# Patient Record
Sex: Female | Born: 1975
Health system: Southern US, Community
[De-identification: ages and names within clinical notes are randomized; demographics above are authoritative.]

## PROBLEM LIST (undated history)

## (undated) ENCOUNTER — Inpatient Hospital Stay (HOSPITAL_COMMUNITY): Payer: Self-pay

## (undated) DIAGNOSIS — A630 Anogenital (venereal) warts: Secondary | ICD-10-CM

## (undated) DIAGNOSIS — G4730 Sleep apnea, unspecified: Secondary | ICD-10-CM

## (undated) DIAGNOSIS — O1490 Unspecified pre-eclampsia, unspecified trimester: Secondary | ICD-10-CM

## (undated) DIAGNOSIS — I10 Essential (primary) hypertension: Secondary | ICD-10-CM

## (undated) DIAGNOSIS — B977 Papillomavirus as the cause of diseases classified elsewhere: Secondary | ICD-10-CM

## (undated) DIAGNOSIS — R319 Hematuria, unspecified: Secondary | ICD-10-CM

## (undated) HISTORY — DX: Unspecified pre-eclampsia, unspecified trimester: O14.90

## (undated) HISTORY — DX: Sleep apnea, unspecified: G47.30

## (undated) HISTORY — DX: Hematuria, unspecified: R31.9

## (undated) HISTORY — DX: Papillomavirus as the cause of diseases classified elsewhere: B97.7

## (undated) HISTORY — PX: APPENDECTOMY: SHX54

## (undated) HISTORY — DX: Anogenital (venereal) warts: A63.0

---
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
## 2000-01-10 ENCOUNTER — Emergency Department (HOSPITAL_COMMUNITY): Admission: EM | Admit: 2000-01-10 | Discharge: 2000-01-10 | Payer: Self-pay

## 2001-04-26 ENCOUNTER — Other Ambulatory Visit: Admission: RE | Admit: 2001-04-26 | Discharge: 2001-04-26 | Payer: Self-pay | Admitting: Obstetrics and Gynecology

## 2002-04-28 ENCOUNTER — Encounter: Payer: Self-pay | Admitting: Family Medicine

## 2002-04-28 ENCOUNTER — Ambulatory Visit (HOSPITAL_COMMUNITY): Admission: RE | Admit: 2002-04-28 | Discharge: 2002-04-28 | Payer: Self-pay | Admitting: Family Medicine

## 2002-06-13 ENCOUNTER — Other Ambulatory Visit: Admission: RE | Admit: 2002-06-13 | Discharge: 2002-06-13 | Payer: Self-pay | Admitting: Obstetrics & Gynecology

## 2003-03-18 HISTORY — PX: LASER ABLATION OF THE CERVIX: SHX1949

## 2003-07-24 ENCOUNTER — Other Ambulatory Visit: Admission: RE | Admit: 2003-07-24 | Discharge: 2003-07-24 | Payer: Self-pay | Admitting: Obstetrics & Gynecology

## 2003-09-12 ENCOUNTER — Ambulatory Visit (HOSPITAL_COMMUNITY): Admission: RE | Admit: 2003-09-12 | Discharge: 2003-09-12 | Payer: Self-pay | Admitting: Obstetrics & Gynecology

## 2007-06-16 ENCOUNTER — Other Ambulatory Visit: Admission: RE | Admit: 2007-06-16 | Discharge: 2007-06-16 | Payer: Self-pay | Admitting: Obstetrics and Gynecology

## 2008-01-03 ENCOUNTER — Ambulatory Visit: Payer: Self-pay | Admitting: Obstetrics and Gynecology

## 2008-01-03 ENCOUNTER — Inpatient Hospital Stay (HOSPITAL_COMMUNITY): Admission: AD | Admit: 2008-01-03 | Discharge: 2008-01-05 | Payer: Self-pay | Admitting: Obstetrics & Gynecology

## 2009-03-21 ENCOUNTER — Other Ambulatory Visit: Admission: RE | Admit: 2009-03-21 | Discharge: 2009-03-21 | Payer: Self-pay | Admitting: Obstetrics and Gynecology

## 2010-01-07 ENCOUNTER — Emergency Department (HOSPITAL_COMMUNITY): Admission: EM | Admit: 2010-01-07 | Discharge: 2010-01-07 | Payer: Self-pay | Admitting: Emergency Medicine

## 2010-05-29 LAB — BASIC METABOLIC PANEL
BUN: 8 mg/dL (ref 6–23)
CO2: 28 mEq/L (ref 19–32)
Calcium: 9.4 mg/dL (ref 8.4–10.5)
Chloride: 101 mEq/L (ref 96–112)
Creatinine, Ser: 0.77 mg/dL (ref 0.4–1.2)
GFR calc Af Amer: >60 mL/min (ref >60)
GFR calc non Af Amer: >60 mL/min (ref >60)
Glucose, Bld: 133 mg/dL — ABNORMAL HIGH (ref 70–99)
Potassium: 3.5 mEq/L (ref 3.5–5.1)
Sodium: 138 mEq/L (ref 135–145)

## 2010-05-29 LAB — DIFFERENTIAL
Basophils Absolute: 0 10*3/uL (ref 0.0–0.1)
Basophils Relative: 0 % (ref 0–1)
Eosinophils Absolute: 0.2 10*3/uL (ref 0.0–0.7)
Eosinophils Relative: 1 % (ref 0–5)
Lymphocytes Relative: 29 % (ref 12–46)
Lymphs Abs: 3.6 10*3/uL (ref 0.7–4.0)
Monocytes Absolute: 0.6 10*3/uL (ref 0.1–1.0)
Monocytes Relative: 5 % (ref 3–12)
Neutro Abs: 8 10*3/uL — ABNORMAL HIGH (ref 1.7–7.7)
Neutrophils Relative %: 64 % (ref 43–77)

## 2010-05-29 LAB — CBC
HCT: 40.3 % (ref 36.0–46.0)
Hemoglobin: 14 g/dL (ref 12.0–15.0)
MCH: 31 pg (ref 26.0–34.0)
MCHC: 34.8 g/dL (ref 30.0–36.0)
MCV: 89.1 fL (ref 78.0–100.0)
Platelets: 284 10*3/uL (ref 150–400)
RBC: 4.52 MIL/uL (ref 3.87–5.11)
RDW: 11.9 % (ref 11.5–15.5)
WBC: 12.4 10*3/uL — ABNORMAL HIGH (ref 4.0–10.5)

## 2010-05-29 LAB — CK TOTAL AND CKMB (NOT AT ARMC)
CK, MB: 0.8 ng/mL (ref 0.3–4.0)
Relative Index: INVALID (ref 0.0–2.5)
Total CK: 72 U/L (ref 7–177)

## 2010-08-02 NOTE — Op Note (Signed)
[name], Sherry Garrett                      ACCOUNT NO.:  [id number]   MEDICAL RECORD NO.:  [id number]                   PATIENT TYPE:  AMB   LOCATION:  DAY                                  FACILITY:  [facility]   PHYSICIAN:  Lazaro Arms, M.D.                DATE OF BIRTH:  01-29-1976   DATE OF PROCEDURE:  09/12/2003  DATE OF DISCHARGE:                                 OPERATIVE REPORT   PREOPERATIVE DIAGNOSIS:  Moderate cervical dysplasia.   POSTOPERATIVE DIAGNOSIS:  Moderate cervical dysplasia.   PROCEDURE:  Laser ablation of the cervix.   SURGEON:  Lazaro Arms, M.D.   ANESTHESIA:  Laryngeal mask airway.   FINDINGS:  The patient had colposcopic-directed biopsies in the office which  revealed moderate dysplasia.  She had an ASCUS with possible high-grade  cells last year, and the biopsy was normal.  This year, it returned moderate  dysplasia, biopsied in the same area.  This was confirmed today with the  colposcope.  The lesion was from about 11:30 to 1 o'clock in a parametal  fashion of the cervix.  The entire transformation zone was treated.   DESCRIPTION OF PROCEDURE:  The patient was taken to the operating room and  placed in the supine position where she underwent laryngeal mask airway  anesthesia.   A speculum was placed.  A paracervical block was placed.  Acetic acid was  used.  Colposcopy was performed.  The lesion was identified without  difficulty.  A good margin was obtained around the dysplasia.  The laser was  used to ablate the cervix down to 5 to 7 mm peripherally and 7 to 9 mm  centrally.  There was good hemostasis.  Monsel's was placed.   The patient tolerated the procedure well.  She was awakened from anesthesia  and was taken to the recovery room in good and stable condition.  All counts  were correct.      ___________________________________________                                            Lazaro Arms, M.D.   LHE/MEDQ  D:  09/12/2003  T:   09/12/2003  Job:  [id number]

## 2010-12-17 LAB — CBC
HCT: 36.5
Hemoglobin: 12.5
MCV: 93.4
RBC: 3.91
WBC: 15.2 — ABNORMAL HIGH

## 2011-04-08 ENCOUNTER — Other Ambulatory Visit (HOSPITAL_COMMUNITY)
Admission: RE | Admit: 2011-04-08 | Discharge: 2011-04-08 | Disposition: A | Discharge status: Home / Self Care | Payer: Federal, State, Local not specified - PPO | Source: Ambulatory Visit | Attending: Obstetrics & Gynecology | Admitting: Obstetrics & Gynecology

## 2011-04-08 DIAGNOSIS — Z01419 Encounter for gynecological examination (general) (routine) without abnormal findings: Secondary | ICD-10-CM | POA: Insufficient documentation

## 2012-04-19 ENCOUNTER — Other Ambulatory Visit: Payer: Self-pay | Admitting: Adult Health

## 2012-04-19 ENCOUNTER — Other Ambulatory Visit (HOSPITAL_COMMUNITY)
Admission: RE | Admit: 2012-04-19 | Discharge: 2012-04-19 | Disposition: A | Discharge status: Home / Self Care | Payer: Federal, State, Local not specified - PPO | Source: Ambulatory Visit | Attending: Obstetrics and Gynecology | Admitting: Obstetrics and Gynecology

## 2012-04-19 DIAGNOSIS — Z1151 Encounter for screening for human papillomavirus (HPV): Secondary | ICD-10-CM | POA: Insufficient documentation

## 2012-04-19 DIAGNOSIS — Z01419 Encounter for gynecological examination (general) (routine) without abnormal findings: Secondary | ICD-10-CM | POA: Insufficient documentation

## 2012-06-21 ENCOUNTER — Encounter: Payer: Self-pay | Admitting: *Deleted

## 2012-06-21 DIAGNOSIS — A630 Anogenital (venereal) warts: Secondary | ICD-10-CM

## 2012-06-21 DIAGNOSIS — B977 Papillomavirus as the cause of diseases classified elsewhere: Secondary | ICD-10-CM | POA: Insufficient documentation

## 2012-06-21 DIAGNOSIS — R319 Hematuria, unspecified: Secondary | ICD-10-CM | POA: Insufficient documentation

## 2012-06-22 ENCOUNTER — Ambulatory Visit: Payer: Self-pay

## 2012-06-23 ENCOUNTER — Encounter: Payer: Self-pay | Admitting: Obstetrics & Gynecology

## 2012-06-23 ENCOUNTER — Ambulatory Visit (INDEPENDENT_AMBULATORY_CARE_PROVIDER_SITE_OTHER): Payer: BC Managed Care – PPO | Admitting: Obstetrics & Gynecology

## 2012-06-23 VITALS — BP 120/80 | Ht 62.0 in | Wt 160.0 lb

## 2012-06-23 DIAGNOSIS — Z3202 Encounter for pregnancy test, result negative: Secondary | ICD-10-CM

## 2012-06-23 DIAGNOSIS — Z3049 Encounter for surveillance of other contraceptives: Secondary | ICD-10-CM

## 2012-06-23 DIAGNOSIS — Z309 Encounter for contraceptive management, unspecified: Secondary | ICD-10-CM

## 2012-06-23 LAB — POCT URINE PREGNANCY: Preg Test, Ur: NEGATIVE

## 2012-06-23 MED ORDER — MEDROXYPROGESTERONE ACETATE 150 MG/ML IM SUSP
150.0000 mg | Freq: Once | INTRAMUSCULAR | Status: AC
  Administered 2012-06-23: 150 mg via INTRAMUSCULAR

## 2012-09-14 ENCOUNTER — Ambulatory Visit: Payer: BC Managed Care – PPO

## 2013-11-28 ENCOUNTER — Ambulatory Visit (INDEPENDENT_AMBULATORY_CARE_PROVIDER_SITE_OTHER): Payer: BC Managed Care – PPO | Admitting: Adult Health

## 2013-11-28 ENCOUNTER — Encounter: Payer: Self-pay | Admitting: Adult Health

## 2013-11-28 DIAGNOSIS — Z3201 Encounter for pregnancy test, result positive: Secondary | ICD-10-CM

## 2013-11-28 LAB — POCT URINE PREGNANCY: Preg Test, Ur: POSITIVE

## 2013-11-28 NOTE — Progress Notes (Signed)
Pt here for pregnancy test. Positive result. Advised spotting and cramping can be normal in early pregnancy, that's just everything getting settled into place. Advised if she notices either, push fluids, take it easy, and call office. Pt reports a "stretching sensation". No cramping or spotting. Pt voiced understanding. JSY

## 2013-12-15 ENCOUNTER — Other Ambulatory Visit: Payer: Self-pay | Admitting: Obstetrics & Gynecology

## 2013-12-15 DIAGNOSIS — O3680X0 Pregnancy with inconclusive fetal viability, not applicable or unspecified: Secondary | ICD-10-CM

## 2013-12-19 ENCOUNTER — Ambulatory Visit (INDEPENDENT_AMBULATORY_CARE_PROVIDER_SITE_OTHER): Payer: BC Managed Care – PPO

## 2013-12-19 ENCOUNTER — Other Ambulatory Visit: Payer: Self-pay | Admitting: Obstetrics & Gynecology

## 2013-12-19 DIAGNOSIS — O09521 Supervision of elderly multigravida, first trimester: Secondary | ICD-10-CM

## 2013-12-19 DIAGNOSIS — O3680X0 Pregnancy with inconclusive fetal viability, not applicable or unspecified: Secondary | ICD-10-CM

## 2013-12-19 DIAGNOSIS — O09291 Supervision of pregnancy with other poor reproductive or obstetric history, first trimester: Secondary | ICD-10-CM

## 2013-12-19 NOTE — Progress Notes (Signed)
U/S(7+3wks)-single IUP with +FCA noted, FHR-156 bpm, CRL c/w LMP dates, cx appears closed, bilateral adnexa appears WNL

## 2014-01-03 ENCOUNTER — Other Ambulatory Visit (HOSPITAL_COMMUNITY)
Admission: RE | Admit: 2014-01-03 | Discharge: 2014-01-03 | Disposition: A | Discharge status: Home / Self Care | Payer: Federal, State, Local not specified - PPO | Source: Ambulatory Visit | Attending: Advanced Practice Midwife | Admitting: Advanced Practice Midwife

## 2014-01-03 ENCOUNTER — Ambulatory Visit (INDEPENDENT_AMBULATORY_CARE_PROVIDER_SITE_OTHER): Payer: BC Managed Care – PPO | Admitting: Advanced Practice Midwife

## 2014-01-03 ENCOUNTER — Encounter: Payer: Self-pay | Admitting: Advanced Practice Midwife

## 2014-01-03 VITALS — BP 124/80 | Wt 165.0 lb

## 2014-01-03 DIAGNOSIS — Z114 Encounter for screening for human immunodeficiency virus [HIV]: Secondary | ICD-10-CM

## 2014-01-03 DIAGNOSIS — Z0283 Encounter for blood-alcohol and blood-drug test: Secondary | ICD-10-CM

## 2014-01-03 DIAGNOSIS — B977 Papillomavirus as the cause of diseases classified elsewhere: Secondary | ICD-10-CM

## 2014-01-03 DIAGNOSIS — Z01419 Encounter for gynecological examination (general) (routine) without abnormal findings: Secondary | ICD-10-CM | POA: Diagnosis present

## 2014-01-03 DIAGNOSIS — Z118 Encounter for screening for other infectious and parasitic diseases: Secondary | ICD-10-CM

## 2014-01-03 DIAGNOSIS — Z331 Pregnant state, incidental: Secondary | ICD-10-CM

## 2014-01-03 DIAGNOSIS — Z1371 Encounter for nonprocreative screening for genetic disease carrier status: Secondary | ICD-10-CM

## 2014-01-03 DIAGNOSIS — Z3481 Encounter for supervision of other normal pregnancy, first trimester: Secondary | ICD-10-CM

## 2014-01-03 DIAGNOSIS — Z3491 Encounter for supervision of normal pregnancy, unspecified, first trimester: Secondary | ICD-10-CM

## 2014-01-03 DIAGNOSIS — O09899 Supervision of other high risk pregnancies, unspecified trimester: Secondary | ICD-10-CM | POA: Insufficient documentation

## 2014-01-03 DIAGNOSIS — F17210 Nicotine dependence, cigarettes, uncomplicated: Secondary | ICD-10-CM

## 2014-01-03 DIAGNOSIS — Z1159 Encounter for screening for other viral diseases: Secondary | ICD-10-CM

## 2014-01-03 DIAGNOSIS — Z0184 Encounter for antibody response examination: Secondary | ICD-10-CM

## 2014-01-03 DIAGNOSIS — Z1389 Encounter for screening for other disorder: Secondary | ICD-10-CM

## 2014-01-03 DIAGNOSIS — Z1151 Encounter for screening for human papillomavirus (HPV): Secondary | ICD-10-CM | POA: Diagnosis present

## 2014-01-03 DIAGNOSIS — Z113 Encounter for screening for infections with a predominantly sexual mode of transmission: Secondary | ICD-10-CM

## 2014-01-03 DIAGNOSIS — Z23 Encounter for immunization: Secondary | ICD-10-CM

## 2014-01-03 LAB — POCT URINALYSIS DIPSTICK
Blood, UA: NEGATIVE
Glucose, UA: NEGATIVE
Ketones, UA: NEGATIVE
LEUKOCYTES UA: NEGATIVE
NITRITE UA: NEGATIVE
PROTEIN UA: 2

## 2014-01-03 LAB — CBC
HCT: 37.1 % (ref 36.0–46.0)
Hemoglobin: 13.2 g/dL (ref 12.0–15.0)
MCH: 30.4 pg (ref 26.0–34.0)
MCHC: 35.6 g/dL (ref 30.0–36.0)
MCV: 85.5 fL (ref 78.0–100.0)
PLATELETS: 275 10*3/uL (ref 150–400)
RBC: 4.34 MIL/uL (ref 3.87–5.11)
RDW: 12.7 % (ref 11.5–15.5)
WBC: 10.4 10*3/uL (ref 4.0–10.5)

## 2014-01-03 NOTE — Progress Notes (Signed)
[facility]NC form and lab consents given to read over and sign. Pt denies any problems or concerns at this time.  

## 2014-01-03 NOTE — Progress Notes (Signed)
  Subjective:    Sherry Garrett is a [id number] 1172w4d being seen today for her first obstetrical visit.  Her obstetrical history is significant for smoker.  She reports cutting back to 5/day since finding out she was pregnant.  Cessation or replacement (gum/patch, ecigs) recommended. Pregnancy history fully reviewed.  Patient reports fatigue.  Filed Vitals:   01/03/14 1539  BP: 124/80  Weight: 165 lb (74.844 kg)    HISTORY: OB History  Gravida Para Term Preterm AB SAB TAB Ectopic Multiple Living  4 2 2  1 1    2     # Outcome Date GA Lbr Len/2nd Weight Sex Delivery Anes PTL Lv  4 CUR           3 TRM 01/03/08 6624w6d   M SVD   Y  2 SAB 1997          1 TRM 1996 7457w0d  7 lb 2 oz (3.232 kg) M SVD EPI  Y     Past Medical History  Diagnosis Date  . Genital warts   . HPV (human papilloma virus) infection   . Hematuria    Past Surgical History  Procedure Laterality Date  . Appendectomy    . Laser ablation of the cervix  2005   Family History  Problem Relation Age of Onset  . Diabetes Father   . Heart attack Father   . Hypertension Father   . Cancer Other   . Hypertension Other   . Cancer Paternal Grandfather   . Diabetes Paternal Grandmother   . Heart attack Paternal Grandmother   . Stroke Maternal Grandmother     mini strokes  . Cancer Maternal Grandfather     prostate  . Diabetes Brother      Exam       Pelvic Exam:    Perineum: Normal Perineum   Vulva: normal   Vagina:  normal mucosa, normal discharge, no palpable nodules   Uterus Normal, Gravid, FH: ~9     Cervix: Normal.  Pap collected (LEEP 2012, one normal pap in 2014)   Adnexa: Not palpable   Urinary:  urethral meatus normal    System: Breast:  normal appearance, no masses or tenderness   Skin: normal coloration and turgor, no rashes    Neurologic: oriented, normal, normal mood   Extremities: normal strength, tone, and muscle mass   HEENT PERRLA   Mouth/Teeth mucous membranes moist, normal dentition    Neck supple and no masses   Cardiovascular: regular rate and rhythm   Respiratory:  appears well, vitals normal, no respiratory distress, acyanotic   Abdomen: soft, non-tender;  FHR: 159 us          Assessment:    Pregnancy: [id number] Patient Active Problem List   Diagnosis Date Noted  . Genital warts 06/21/2012  . Hematuria 06/21/2012  . HPV (human papilloma virus) infection 06/21/2012        Plan:     Initial labs drawn. Continue prenatal vitamins  Problem list reviewed and updated  Reviewed n/v relief measures and warning s/s to report  Reviewed recommended weight gain based on pre-gravid BMI  Encouraged well-balanced diet Genetic Screening discussed Integrated Screen: requested.  Ultrasound discussed; fetal survey: requested.  Follow up in 3 weeks for NT/IT.  CRESENZO-DISHMAN,Kristyne Woodring 01/03/2014

## 2014-01-04 LAB — URINALYSIS
Bilirubin Urine: NEGATIVE
GLUCOSE, UA: NEGATIVE mg/dL
LEUKOCYTES UA: NEGATIVE
Nitrite: NEGATIVE
PROTEIN: 100 mg/dL — AB
Specific Gravity, Urine: 1.021 (ref 1.005–1.030)
UROBILINOGEN UA: 0.2 mg/dL (ref 0.0–1.0)
pH: 6 (ref 5.0–8.0)

## 2014-01-04 LAB — DRUG SCREEN, URINE, NO CONFIRMATION
AMPHETAMINE SCRN UR: NEGATIVE
BARBITURATE QUANT UR: NEGATIVE
BENZODIAZEPINES.: NEGATIVE
COCAINE METABOLITES: NEGATIVE
CREATININE, U: 238.1 mg/dL
METHADONE: NEGATIVE
Marijuana Metabolite: NEGATIVE
Opiate Screen, Urine: NEGATIVE
Phencyclidine (PCP): NEGATIVE
Propoxyphene: NEGATIVE

## 2014-01-04 LAB — ABO AND RH: RH TYPE: POSITIVE

## 2014-01-04 LAB — ANTIBODY SCREEN: Antibody Screen: NEGATIVE

## 2014-01-04 LAB — VARICELLA ZOSTER ANTIBODY, IGG: Varicella IgG: 515.8 Index — ABNORMAL HIGH (ref <135.00)

## 2014-01-04 LAB — GC/CHLAMYDIA PROBE AMP
CT PROBE, AMP APTIMA: NEGATIVE
GC PROBE AMP APTIMA: NEGATIVE

## 2014-01-04 LAB — HIV ANTIBODY (ROUTINE TESTING W REFLEX): HIV: NONREACTIVE

## 2014-01-04 LAB — RPR

## 2014-01-04 LAB — HEPATITIS B SURFACE ANTIGEN: HEP B S AG: NEGATIVE

## 2014-01-04 LAB — OXYCODONE SCREEN, UA, RFLX CONFIRM: Oxycodone Screen, Ur: NEGATIVE ng/mL

## 2014-01-04 LAB — RUBELLA SCREEN: Rubella: 6.08 Index — ABNORMAL HIGH (ref <0.90)

## 2014-01-05 LAB — CYSTIC FIBROSIS DIAGNOSTIC STUDY

## 2014-01-05 LAB — URINE CULTURE
COLONY COUNT: NO GROWTH
Organism ID, Bacteria: NO GROWTH

## 2014-01-05 LAB — CYTOLOGY - PAP

## 2014-01-16 ENCOUNTER — Encounter: Payer: Self-pay | Admitting: Advanced Practice Midwife

## 2014-01-23 ENCOUNTER — Ambulatory Visit (INDEPENDENT_AMBULATORY_CARE_PROVIDER_SITE_OTHER): Payer: BC Managed Care – PPO | Admitting: Women's Health

## 2014-01-23 ENCOUNTER — Ambulatory Visit (INDEPENDENT_AMBULATORY_CARE_PROVIDER_SITE_OTHER): Payer: BC Managed Care – PPO

## 2014-01-23 ENCOUNTER — Encounter: Payer: Self-pay | Admitting: Women's Health

## 2014-01-23 VITALS — BP 124/80 | Wt 166.0 lb

## 2014-01-23 DIAGNOSIS — Z3491 Encounter for supervision of normal pregnancy, unspecified, first trimester: Secondary | ICD-10-CM

## 2014-01-23 DIAGNOSIS — O09521 Supervision of elderly multigravida, first trimester: Secondary | ICD-10-CM

## 2014-01-23 DIAGNOSIS — Z331 Pregnant state, incidental: Secondary | ICD-10-CM

## 2014-01-23 DIAGNOSIS — Q270 Congenital absence and hypoplasia of umbilical artery: Secondary | ICD-10-CM

## 2014-01-23 DIAGNOSIS — R319 Hematuria, unspecified: Secondary | ICD-10-CM

## 2014-01-23 DIAGNOSIS — O09529 Supervision of elderly multigravida, unspecified trimester: Secondary | ICD-10-CM | POA: Insufficient documentation

## 2014-01-23 DIAGNOSIS — Z1389 Encounter for screening for other disorder: Secondary | ICD-10-CM

## 2014-01-23 DIAGNOSIS — Z36 Encounter for antenatal screening of mother: Secondary | ICD-10-CM

## 2014-01-23 DIAGNOSIS — Z3682 Encounter for antenatal screening for nuchal translucency: Secondary | ICD-10-CM

## 2014-01-23 LAB — POCT URINALYSIS DIPSTICK
Glucose, UA: NEGATIVE
KETONES UA: NEGATIVE
LEUKOCYTES UA: NEGATIVE
NITRITE UA: NEGATIVE

## 2014-01-23 NOTE — Progress Notes (Signed)
Nuchal Translucency US completed.  Single, active fetus at 12+[redacted] weeks GA.  CRL measures 6.23 cm which is consistent with dating.  NT measures 1.196mm.  Nasal bone is present.  FHR 162 bpm.  Bilateral ovaries appear WNL.  Cervix closed.

## 2014-01-23 NOTE — Progress Notes (Addendum)
Low-risk OB appointment [id number] 746w3d Estimated Date of Delivery: 08/04/14 BP 124/80 mmHg  Wt 166 lb (75.297 kg)  LMP 10/28/2013  BP, weight, and urine reviewed.  Refer to obstetrical flow sheet for FH & FHR.  No fm yet. Denies cramping, lof, vb, or uti s/s. No complaints. Lg Hgb urine- pt states she has always had Hgb in urine- saw urology years ago- had cystoscopy and all was normal.  Reviewed today's normal nt u/s, warning s/s to report. Plan:  Continue routine obstetrical care  F/U in 4wks for OB appointment and 2nd IT

## 2014-01-23 NOTE — Patient Instructions (Signed)
First Trimester of Pregnancy The first trimester of pregnancy is from week 1 until the end of week 12 (months 1 through 3). A week after a sperm fertilizes an egg, the egg will implant on the wall of the uterus. This embryo will begin to develop into a baby. Genes from you and your partner are forming the baby. The female genes determine whether the baby is a boy or a girl. At 6-8 weeks, the eyes and face are formed, and the heartbeat can be seen on ultrasound. At the end of 12 weeks, all the baby's organs are formed.  Now that you are pregnant, you will want to do everything you can to have a healthy baby. Two of the most important things are to get good prenatal care and to follow your health care provider's instructions. Prenatal care is all the medical care you receive before the baby's birth. This care will help prevent, find, and treat any problems during the pregnancy and childbirth. BODY CHANGES Your body goes through many changes during pregnancy. The changes vary from woman to woman.   You may gain or lose a couple of pounds at first.  You may feel sick to your stomach (nauseous) and throw up (vomit). If the vomiting is uncontrollable, call your health care provider.  You may tire easily.  You may develop headaches that can be relieved by medicines approved by your health care provider.  You may urinate more often. Painful urination may mean you have a bladder infection.  You may develop heartburn as a result of your pregnancy.  You may develop constipation because certain hormones are causing the muscles that push waste through your intestines to slow down.  You may develop hemorrhoids or swollen, bulging veins (varicose veins).  Your breasts may begin to grow larger and become tender. Your nipples may stick out more, and the tissue that surrounds them (areola) may become darker.  Your gums may bleed and may be sensitive to brushing and flossing.  Dark spots or blotches (chloasma,  mask of pregnancy) may develop on your face. This will likely fade after the baby is born.  Your menstrual periods will stop.  You may have a loss of appetite.  You may develop cravings for certain kinds of food.  You may have changes in your emotions from day to day, such as being excited to be pregnant or being concerned that something may go wrong with the pregnancy and baby.  You may have more vivid and strange dreams.  You may have changes in your hair. These can include thickening of your hair, rapid growth, and changes in texture. Some women also have hair loss during or after pregnancy, or hair that feels dry or thin. Your hair will most likely return to normal after your baby is born. WHAT TO EXPECT AT YOUR PRENATAL VISITS During a routine prenatal visit:  You will be weighed to make sure you and the baby are growing normally.  Your blood pressure will be taken.  Your abdomen will be measured to track your baby's growth.  The fetal heartbeat will be listened to starting around week 10 or 12 of your pregnancy.  Test results from any previous visits will be discussed. Your health care provider may ask you:  How you are feeling.  If you are feeling the baby move.  If you have had any abnormal symptoms, such as leaking fluid, bleeding, severe headaches, or abdominal cramping.  If you have any questions. Other tests   that may be performed during your first trimester include:  Blood tests to find your blood type and to check for the presence of any previous infections. They will also be used to check for low iron levels (anemia) and Rh antibodies. Later in the pregnancy, blood tests for diabetes will be done along with other tests if problems develop.  Urine tests to check for infections, diabetes, or protein in the urine.  An ultrasound to confirm the proper growth and development of the baby.  An amniocentesis to check for possible genetic problems.  Fetal screens for  spina bifida and Down syndrome.  You may need other tests to make sure you and the baby are doing well. HOME CARE INSTRUCTIONS  Medicines  Follow your health care provider's instructions regarding medicine use. Specific medicines may be either safe or unsafe to take during pregnancy.  Take your prenatal vitamins as directed.  If you develop constipation, try taking a stool softener if your health care provider approves. Diet  Eat regular, well-balanced meals. Choose a variety of foods, such as meat or vegetable-based protein, fish, milk and low-fat dairy products, vegetables, fruits, and whole grain breads and cereals. Your health care provider will help you determine the amount of weight gain that is right for you.  Avoid raw meat and uncooked cheese. These carry germs that can cause birth defects in the baby.  Eating four or five small meals rather than three large meals a day may help relieve nausea and vomiting. If you start to feel nauseous, eating a few soda crackers can be helpful. Drinking liquids between meals instead of during meals also seems to help nausea and vomiting.  If you develop constipation, eat more high-fiber foods, such as fresh vegetables or fruit and whole grains. Drink enough fluids to keep your urine clear or pale yellow. Activity and Exercise  Exercise only as directed by your health care provider. Exercising will help you:  Control your weight.  Stay in shape.  Be prepared for labor and delivery.  Experiencing pain or cramping in the lower abdomen or low back is a good sign that you should stop exercising. Check with your health care provider before continuing normal exercises.  Try to avoid standing for long periods of time. Move your legs often if you must stand in one place for a long time.  Avoid heavy lifting.  Wear low-heeled shoes, and practice good posture.  You may continue to have sex unless your health care provider directs you  otherwise. Relief of Pain or Discomfort  Wear a good support bra for breast tenderness.   Take warm sitz baths to soothe any pain or discomfort caused by hemorrhoids. Use hemorrhoid cream if your health care provider approves.   Rest with your legs elevated if you have leg cramps or low back pain.  If you develop varicose veins in your legs, wear support hose. Elevate your feet for 15 minutes, 3-4 times a day. Limit salt in your diet. Prenatal Care  Schedule your prenatal visits by the twelfth week of pregnancy. They are usually scheduled monthly at first, then more often in the last 2 months before delivery.  Write down your questions. Take them to your prenatal visits.  Keep all your prenatal visits as directed by your health care provider. Safety  Wear your seat belt at all times when driving.  Make a list of emergency phone numbers, including numbers for family, friends, the hospital, and police and fire departments. General Tips    Ask your health care provider for a referral to a local prenatal education class. Begin classes no later than at the beginning of month 6 of your pregnancy.  Ask for help if you have counseling or nutritional needs during pregnancy. Your health care provider can offer advice or refer you to specialists for help with various needs.  Do not use hot tubs, steam rooms, or saunas.  Do not douche or use tampons or scented sanitary pads.  Do not cross your legs for long periods of time.  Avoid cat litter boxes and soil used by cats. These carry germs that can cause birth defects in the baby and possibly loss of the fetus by miscarriage or stillbirth.  Avoid all smoking, herbs, alcohol, and medicines not prescribed by your health care provider. Chemicals in these affect the formation and growth of the baby.  Schedule a dentist appointment. At home, brush your teeth with a soft toothbrush and be gentle when you floss. SEEK MEDICAL CARE IF:   You have  dizziness.  You have mild pelvic cramps, pelvic pressure, or nagging pain in the abdominal area.  You have persistent nausea, vomiting, or diarrhea.  You have a bad smelling vaginal discharge.  You have pain with urination.  You notice increased swelling in your face, hands, legs, or ankles. SEEK IMMEDIATE MEDICAL CARE IF:   You have a fever.  You are leaking fluid from your vagina.  You have spotting or bleeding from your vagina.  You have severe abdominal cramping or pain.  You have rapid weight gain or loss.  You vomit blood or material that looks like coffee grounds.  You are exposed to German measles and have never had them.  You are exposed to fifth disease or chickenpox.  You develop a severe headache.  You have shortness of breath.  You have any kind of trauma, such as from a fall or a car accident. Document Released: 02/25/2001 Document Revised: 07/18/2013 Document Reviewed: 01/11/2013 Exit[employer] Patient Information 2015 [employer]. This information is not intended to replace advice given to you by your health care provider. Make sure you discuss any questions you have with your health care provider.  

## 2014-01-27 LAB — MATERNAL SCREEN, INTEGRATED #1

## 2014-02-13 ENCOUNTER — Encounter: Payer: Self-pay | Admitting: Family Medicine

## 2014-02-13 ENCOUNTER — Ambulatory Visit (INDEPENDENT_AMBULATORY_CARE_PROVIDER_SITE_OTHER): Payer: BC Managed Care – PPO | Admitting: Family Medicine

## 2014-02-13 VITALS — BP 122/72 | Temp 99.0°F | Ht 62.75 in | Wt 163.0 lb

## 2014-02-13 DIAGNOSIS — J209 Acute bronchitis, unspecified: Secondary | ICD-10-CM

## 2014-02-13 MED ORDER — AZITHROMYCIN 250 MG PO TABS: ORAL_TABLET | ORAL | Status: DC

## 2014-02-13 MED ORDER — ALBUTEROL SULFATE HFA 108 (90 BASE) MCG/ACT IN AERS: 2.0000 | INHALATION_SPRAY | Freq: Four times a day (QID) | RESPIRATORY_TRACT | Status: DC | PRN

## 2014-02-13 MED ORDER — HYDROCODONE-HOMATROPINE 5-1.5 MG/5ML PO SYRP: 5.0000 mL | ORAL_SOLUTION | Freq: Every evening | ORAL | Status: DC | PRN

## 2014-02-13 NOTE — Progress Notes (Signed)
   Subjective:    Patient ID: Sherry Garrett, female    DOB: [birth year], 38 y.o.   MRN: [id number]  Cough This is a new problem. Episode onset: couple of weeks. The problem has been waxing and waning. The cough is productive of sputum. Associated symptoms include rhinorrhea and wheezing. The symptoms are aggravated by lying down. Treatments tried: cough drops. The treatment provided mild relief. Her past medical history is significant for environmental allergies.    Wheezing off and on  Used benadryl s needed for drainage  Patient is [redacted] weeks pregnant. Unfortunately she still smokes. Has had to use an inhaler in the past.  Review of Systems  HENT: Positive for rhinorrhea.   Respiratory: Positive for cough and wheezing.   Allergic/Immunologic: Positive for environmental allergies.       Objective:   Physical Exam  Alert decent hydration. HEENT moderate nasal congestion pharynx normal neck supple. Lungs bilateral wheezes no tachypnea no crackles heart rate and rhythm.      Assessment & Plan:  Impression post viral bronchitis with reactive airways plan encouraged to stop smoking. Hycodan 1 teaspoon daily at bedtime when necessary. Albuterol 2 sprays 4 times a day. Z-Pak. Expect slow resolution. WSL

## 2014-02-20 ENCOUNTER — Ambulatory Visit (INDEPENDENT_AMBULATORY_CARE_PROVIDER_SITE_OTHER): Payer: BC Managed Care – PPO | Admitting: Women's Health

## 2014-02-20 ENCOUNTER — Encounter: Payer: Self-pay | Admitting: Women's Health

## 2014-02-20 VITALS — BP 140/78 | Wt 164.0 lb

## 2014-02-20 DIAGNOSIS — Z3492 Encounter for supervision of normal pregnancy, unspecified, second trimester: Secondary | ICD-10-CM

## 2014-02-20 DIAGNOSIS — Z1389 Encounter for screening for other disorder: Secondary | ICD-10-CM

## 2014-02-20 DIAGNOSIS — Z331 Pregnant state, incidental: Secondary | ICD-10-CM

## 2014-02-20 DIAGNOSIS — Z3682 Encounter for antenatal screening for nuchal translucency: Secondary | ICD-10-CM

## 2014-02-20 DIAGNOSIS — Z363 Encounter for antenatal screening for malformations: Secondary | ICD-10-CM

## 2014-02-20 LAB — POCT URINALYSIS DIPSTICK
GLUCOSE UA: NEGATIVE
Ketones, UA: NEGATIVE
Leukocytes, UA: NEGATIVE
Nitrite, UA: NEGATIVE

## 2014-02-20 NOTE — Patient Instructions (Signed)
Second Trimester of Pregnancy The second trimester is from week 13 through week 28, months 4 through 6. The second trimester is often a time when you feel your best. Your body has also adjusted to being pregnant, and you begin to feel better physically. Usually, morning sickness has lessened or quit completely, you may have more energy, and you may have an increase in appetite. The second trimester is also a time when the fetus is growing rapidly. At the end of the sixth month, the fetus is about 9 inches long and weighs about 1 pounds. You will likely begin to feel the baby move (quickening) between 18 and 20 weeks of the pregnancy. BODY CHANGES Your body goes through many changes during pregnancy. The changes vary from woman to woman.   Your weight will continue to increase. You will notice your lower abdomen bulging out.  You may begin to get stretch marks on your hips, abdomen, and breasts.  You may develop headaches that can be relieved by medicines approved by your health care provider.  You may urinate more often because the fetus is pressing on your bladder.  You may develop or continue to have heartburn as a result of your pregnancy.  You may develop constipation because certain hormones are causing the muscles that push waste through your intestines to slow down.  You may develop hemorrhoids or swollen, bulging veins (varicose veins).  You may have back pain because of the weight gain and pregnancy hormones relaxing your joints between the bones in your pelvis and as a result of a shift in weight and the muscles that support your balance.  Your breasts will continue to grow and be tender.  Your gums may bleed and may be sensitive to brushing and flossing.  Dark spots or blotches (chloasma, mask of pregnancy) may develop on your face. This will likely fade after the baby is born.  A dark line from your belly button to the pubic area (linea nigra) may appear. This will likely fade  after the baby is born.  You may have changes in your hair. These can include thickening of your hair, rapid growth, and changes in texture. Some women also have hair loss during or after pregnancy, or hair that feels dry or thin. Your hair will most likely return to normal after your baby is born. WHAT TO EXPECT AT YOUR PRENATAL VISITS During a routine prenatal visit:  You will be weighed to make sure you and the fetus are growing normally.  Your blood pressure will be taken.  Your abdomen will be measured to track your baby's growth.  The fetal heartbeat will be listened to.  Any test results from the previous visit will be discussed. Your health care provider may ask you:  How you are feeling.  If you are feeling the baby move.  If you have had any abnormal symptoms, such as leaking fluid, bleeding, severe headaches, or abdominal cramping.  If you have any questions. Other tests that may be performed during your second trimester include:  Blood tests that check for:  Low iron levels (anemia).  Gestational diabetes (between 24 and 28 weeks).  Rh antibodies.  Urine tests to check for infections, diabetes, or protein in the urine.  An ultrasound to confirm the proper growth and development of the baby.  An amniocentesis to check for possible genetic problems.  Fetal screens for spina bifida and Down syndrome. HOME CARE INSTRUCTIONS   Avoid all smoking, herbs, alcohol, and unprescribed   drugs. These chemicals affect the formation and growth of the baby.  Follow your health care provider's instructions regarding medicine use. There are medicines that are either safe or unsafe to take during pregnancy.  Exercise only as directed by your health care provider. Experiencing uterine cramps is a good sign to stop exercising.  Continue to eat regular, healthy meals.  Wear a good support bra for breast tenderness.  Do not use hot tubs, steam rooms, or saunas.  Wear your  seat belt at all times when driving.  Avoid raw meat, uncooked cheese, cat litter boxes, and soil used by cats. These carry germs that can cause birth defects in the baby.  Take your prenatal vitamins.  Try taking a stool softener (if your health care provider approves) if you develop constipation. Eat more high-fiber foods, such as fresh vegetables or fruit and whole grains. Drink plenty of fluids to keep your urine clear or pale yellow.  Take warm sitz baths to soothe any pain or discomfort caused by hemorrhoids. Use hemorrhoid cream if your health care provider approves.  If you develop varicose veins, wear support hose. Elevate your feet for 15 minutes, 3-4 times a day. Limit salt in your diet.  Avoid heavy lifting, wear low heel shoes, and practice good posture.  Rest with your legs elevated if you have leg cramps or low back pain.  Visit your dentist if you have not gone yet during your pregnancy. Use a soft toothbrush to brush your teeth and be gentle when you floss.  A sexual relationship may be continued unless your health care provider directs you otherwise.  Continue to go to all your prenatal visits as directed by your health care provider. SEEK MEDICAL CARE IF:   You have dizziness.  You have mild pelvic cramps, pelvic pressure, or nagging pain in the abdominal area.  You have persistent nausea, vomiting, or diarrhea.  You have a bad smelling vaginal discharge.  You have pain with urination. SEEK IMMEDIATE MEDICAL CARE IF:   You have a fever.  You are leaking fluid from your vagina.  You have spotting or bleeding from your vagina.  You have severe abdominal cramping or pain.  You have rapid weight gain or loss.  You have shortness of breath with chest pain.  You notice sudden or extreme swelling of your face, hands, ankles, feet, or legs.  You have not felt your baby move in over an hour.  You have severe headaches that do not go away with  medicine.  You have vision changes. Document Released: 02/25/2001 Document Revised: 03/08/2013 Document Reviewed: 05/04/2012 Exit[employer] Patient Information 2015 [employer]. This information is not intended to replace advice given to you by your health care provider. Make sure you discuss any questions you have with your health care provider.  

## 2014-02-20 NOTE — Progress Notes (Signed)
Low-risk OB appointment [id number] 7519w3d Estimated Date of Delivery: 08/04/14 BP 140/78 mmHg  Wt 164 lb (74.39 kg)  LMP 10/28/2013  BP, weight, and urine reviewed.  Refer to obstetrical flow sheet for FH & FHR.  Reports good fm.  Denies regular uc's, lof, vb, or uti s/s. No complaints. States at one time she did have to be on a fluid pill briefly for elevated bp's- but came off quickly and bp's have been normal since.  Reviewed warning s/s to report. Plan:  Continue routine obstetrical care, will get 24hr urine baseline- continue to monitor bp's to determine if [facility] F/U in 4wks for OB appointment and anatomy u/s 2nd IT today

## 2014-02-24 LAB — MATERNAL SCREEN, INTEGRATED #2
AFP MoM: 1.88
AFP, SERUM MAT SCREEN: 61 ng/mL
Age risk Down Syndrome: 1:81 {titer}
CALCULATED GESTATIONAL AGE MAT SCREEN: 16.4
CROWN RUMP LENGTH MAT SCREEN 2: 62.3 mm
Estriol Mom: 1.21
Estriol, Free: 1.08 ng/mL
HCG, SERUM MAT SCREEN: 70.9 [IU]/mL
INHIBIN A DIMERIC MAT SCREEN: 421 pg/mL
INHIBIN A MOM MAT SCREEN: 2.62
MSS Down Syndrome: <1:5000 {titer}
NT MOM MAT SCREEN: 1.13
Nuchal Translucency: 1.6 mm
Number of fetuses: 1
PAPP-A MOM MAT SCREEN: 0.78
PAPP-A: 452 ng/mL
hCG MoM: 2.34

## 2014-02-27 ENCOUNTER — Other Ambulatory Visit: Payer: BC Managed Care – PPO

## 2014-02-27 DIAGNOSIS — R030 Elevated blood-pressure reading, without diagnosis of hypertension: Principal | ICD-10-CM

## 2014-02-27 DIAGNOSIS — IMO0001 Reserved for inherently not codable concepts without codable children: Secondary | ICD-10-CM

## 2014-02-28 LAB — PROTEIN, URINE, 24 HOUR
Protein, 24H Urine: 270 mg/d — ABNORMAL HIGH (ref <150)
Protein, Urine: 9 mg/dL (ref 5–24)

## 2014-03-17 NOTE — L&D Delivery Note (Cosign Needed)
Delivery Note Pt became complete and pushed with one ctx to SVD at 11:11 PM a viable female was delivered via  (Presentation: Left Occiput Anterior).  APGAR: 9, 10; weight: 4+15.4.  Cord clamped and cut by FOB; hospital cord blood sample collected.  Placenta status: Intact, Spontaneous.  Cord: 3 vessels with the following complications: None.    Anesthesia: Epidural  Episiotomy: None Lacerations: None Est. Blood Loss (mL):  320  Mom to postpartum.  Baby to Couplet care / Skin to Skin.  Cam HaiSHAW, KIMBERLY CNM 07/14/2014, 11:34 PM

## 2014-03-21 ENCOUNTER — Ambulatory Visit (INDEPENDENT_AMBULATORY_CARE_PROVIDER_SITE_OTHER): Payer: Federal, State, Local not specified - PPO

## 2014-03-21 ENCOUNTER — Other Ambulatory Visit: Payer: Self-pay | Admitting: Women's Health

## 2014-03-21 ENCOUNTER — Encounter: Payer: Self-pay | Admitting: Obstetrics & Gynecology

## 2014-03-21 ENCOUNTER — Ambulatory Visit (INDEPENDENT_AMBULATORY_CARE_PROVIDER_SITE_OTHER): Payer: Federal, State, Local not specified - PPO | Admitting: Obstetrics & Gynecology

## 2014-03-21 VITALS — BP 138/80 | Wt 166.4 lb

## 2014-03-21 DIAGNOSIS — O3442 Maternal care for other abnormalities of cervix, second trimester: Secondary | ICD-10-CM

## 2014-03-21 DIAGNOSIS — O09522 Supervision of elderly multigravida, second trimester: Secondary | ICD-10-CM

## 2014-03-21 DIAGNOSIS — Z1389 Encounter for screening for other disorder: Secondary | ICD-10-CM

## 2014-03-21 DIAGNOSIS — Z331 Pregnant state, incidental: Secondary | ICD-10-CM

## 2014-03-21 DIAGNOSIS — Z36 Encounter for antenatal screening of mother: Secondary | ICD-10-CM

## 2014-03-21 DIAGNOSIS — Z3492 Encounter for supervision of normal pregnancy, unspecified, second trimester: Secondary | ICD-10-CM

## 2014-03-21 DIAGNOSIS — Z363 Encounter for antenatal screening for malformations: Secondary | ICD-10-CM

## 2014-03-21 LAB — POCT URINALYSIS DIPSTICK
GLUCOSE UA: NEGATIVE
KETONES UA: NEGATIVE
Leukocytes, UA: NEGATIVE
Nitrite, UA: NEGATIVE
Protein, UA: NEGATIVE
RBC UA: 3

## 2014-03-21 NOTE — Progress Notes (Signed)
U/S(20+4wks)-active fetus, meas c/w dates, fluid wnl, posterior Gr 0 placenta, cx appears closed (3.3cm), bilateral adnexa appears WNL, FHR-154 bpm, female fetus!!, no major abnl noted

## 2014-03-21 NOTE — Progress Notes (Signed)
Sonogram is done reviewed read and report is done  [id number] 7216w4d Estimated Date of Delivery: 08/04/14  Blood pressure 138/80, weight 166 lb 6.4 oz (75.479 kg), last menstrual period 10/28/2013.   BP weight and urine results all reviewed and noted.  Please refer to the obstetrical flow sheet for the fundal height and fetal heart rate documentation:  Patient reports good fetal movement, denies any bleeding and no rupture of membranes symptoms or regular contractions. Patient is without complaints. All questions were answered.  Plan:  Continued routine obstetrical care,   Follow up in 4 weeks for OB appointment, routine

## 2014-04-18 ENCOUNTER — Ambulatory Visit (INDEPENDENT_AMBULATORY_CARE_PROVIDER_SITE_OTHER): Payer: Federal, State, Local not specified - PPO | Admitting: Obstetrics & Gynecology

## 2014-04-18 VITALS — BP 150/78 | Wt 169.0 lb

## 2014-04-18 DIAGNOSIS — Z331 Pregnant state, incidental: Secondary | ICD-10-CM

## 2014-04-18 DIAGNOSIS — Z3492 Encounter for supervision of normal pregnancy, unspecified, second trimester: Secondary | ICD-10-CM

## 2014-04-18 DIAGNOSIS — Z1389 Encounter for screening for other disorder: Secondary | ICD-10-CM

## 2014-04-18 LAB — POCT URINALYSIS DIPSTICK
Blood, UA: 3
GLUCOSE UA: NEGATIVE
Ketones, UA: NEGATIVE
Leukocytes, UA: NEGATIVE
Nitrite, UA: NEGATIVE

## 2014-04-18 MED ORDER — LABETALOL HCL 100 MG PO TABS: 100.0000 mg | ORAL_TABLET | Freq: Two times a day (BID) | ORAL | Status: DC

## 2014-04-18 NOTE — Addendum Note (Signed)
Addended by: Lazaro ArmsEURE, LUTHER H on: 04/18/2014 04:39 PM   Modules accepted: Orders

## 2014-04-18 NOTE — Progress Notes (Signed)
BP has crept up a bit more will start labetalol 100 mg BID, was borderline last pregnancy   High Risk Pregnancy Diagnosis(es):   Chronic Hypertension  [id number] 2974w4d Estimated Date of Delivery: 08/04/14  Blood pressure 150/78, weight 169 lb (76.658 kg), last menstrual period 10/28/2013.  Urinalysis: Negative   HPI: No complaints     BP weight and urine results all reviewed and noted. Patient reports good fetal movement, denies any bleeding and no rupture of membranes symptoms or regular contractions.  Fundal Height:  26 Fetal Heart rate:  166 Edema:  none  Patient is without complaints. All questions were answered.  Assessment:  5174w4d,   Chronic hypertension  Medication(s) Plans:  Labetalol 100 BID  Treatment Plan:  No changes  Follow up in 4 weeks for OB appt, PN2

## 2014-05-16 ENCOUNTER — Ambulatory Visit (INDEPENDENT_AMBULATORY_CARE_PROVIDER_SITE_OTHER): Payer: Federal, State, Local not specified - PPO | Admitting: Obstetrics & Gynecology

## 2014-05-16 ENCOUNTER — Encounter: Payer: Self-pay | Admitting: Obstetrics & Gynecology

## 2014-05-16 ENCOUNTER — Other Ambulatory Visit: Payer: Federal, State, Local not specified - PPO

## 2014-05-16 VITALS — BP 136/78 | HR 100 | Wt 168.5 lb

## 2014-05-16 DIAGNOSIS — Z331 Pregnant state, incidental: Secondary | ICD-10-CM

## 2014-05-16 DIAGNOSIS — Z0184 Encounter for antibody response examination: Secondary | ICD-10-CM

## 2014-05-16 DIAGNOSIS — Z131 Encounter for screening for diabetes mellitus: Secondary | ICD-10-CM

## 2014-05-16 DIAGNOSIS — O0993 Supervision of high risk pregnancy, unspecified, third trimester: Secondary | ICD-10-CM

## 2014-05-16 DIAGNOSIS — Z3483 Encounter for supervision of other normal pregnancy, third trimester: Secondary | ICD-10-CM

## 2014-05-16 DIAGNOSIS — Z113 Encounter for screening for infections with a predominantly sexual mode of transmission: Secondary | ICD-10-CM

## 2014-05-16 DIAGNOSIS — Z114 Encounter for screening for human immunodeficiency virus [HIV]: Secondary | ICD-10-CM

## 2014-05-16 DIAGNOSIS — Z1389 Encounter for screening for other disorder: Secondary | ICD-10-CM

## 2014-05-16 DIAGNOSIS — O163 Unspecified maternal hypertension, third trimester: Secondary | ICD-10-CM

## 2014-05-16 LAB — POCT URINALYSIS DIPSTICK
Glucose, UA: NEGATIVE
Ketones, UA: NEGATIVE
Leukocytes, UA: NEGATIVE
NITRITE UA: NEGATIVE
Protein, UA: NEGATIVE
RBC UA: 2

## 2014-05-16 NOTE — Progress Notes (Signed)
Fetal Surveillance Testing today:  None today   High Risk Pregnancy Diagnosis(es):   Chronic Hypertension  G4P2012 2150w4d Estimated Date of Delivery: 08/04/14  Blood pressure 136/78, pulse 100, weight 168 lb 8 oz (76.431 kg), last menstrual period 10/28/2013.  Urinalysis: Negative   HPI: The patient is being seen today for ongoing management of chronic hypertension in pregnancy. Today she reports just iredness and Pregnancy brain   BP weight and urine results all reviewed and noted. Patient reports good fetal movement, denies any bleeding and no rupture of membranes symptoms or regular contractions.  Fundal Height:  30 Fetal Heart rate:  150 Edema:  none  Patient is without complaints other than noted in her HPI. All questions were answered.  All lab and sonogram results have been reviewed. Comments: normal PN2 done today  Assessment:  1.  Pregnancy at 1350w4d,  Estimated Date of Delivery: 08/04/14 :  stable                        2.  Chronic hypertension: stable                        3.  AMA     Medication(s) Plans:  Continue labetalol 100 mg BID  Treatment Plan:  Twice weekly assessment at 32 weeks  Follow up in 2 weeks for appointment for high risk OB care,

## 2014-05-16 NOTE — Progress Notes (Signed)
Pt denies any problems or concerns at this time.  

## 2014-05-17 LAB — CBC
HCT: 36.2 % (ref 34.0–46.6)
HEMOGLOBIN: 12.6 g/dL (ref 11.1–15.9)
MCH: 31 pg (ref 26.6–33.0)
MCHC: 34.8 g/dL (ref 31.5–35.7)
MCV: 89 fL (ref 79–97)
Platelets: 241 10*3/uL (ref 150–379)
RBC: 4.07 x10E6/uL (ref 3.77–5.28)
RDW: 13 % (ref 12.3–15.4)
WBC: 15.3 10*3/uL — ABNORMAL HIGH (ref 3.4–10.8)

## 2014-05-17 LAB — GLUCOSE TOLERANCE, 2 HOURS W/ 1HR
GLUCOSE, 1 HOUR: 161 mg/dL (ref 65–179)
GLUCOSE, 2 HOUR: 133 mg/dL (ref 65–152)
Glucose, Fasting: 75 mg/dL (ref 65–91)

## 2014-05-17 LAB — RPR: RPR: NONREACTIVE

## 2014-05-17 LAB — HIV ANTIBODY (ROUTINE TESTING W REFLEX): HIV Screen 4th Generation wRfx: NONREACTIVE

## 2014-05-17 LAB — HSV 2 ANTIBODY, IGG: HSV 2 Glycoprotein G Ab, IgG: <0.91 index (ref 0.00–0.90)

## 2014-05-17 LAB — ANTIBODY SCREEN: ANTIBODY SCREEN: NEGATIVE

## 2014-05-18 ENCOUNTER — Telehealth: Payer: Self-pay | Admitting: Obstetrics & Gynecology

## 2014-05-18 NOTE — Telephone Encounter (Signed)
Pt informed of WNL GTT results from 05/16/2014.

## 2014-06-06 ENCOUNTER — Encounter: Payer: Federal, State, Local not specified - PPO | Admitting: Women's Health

## 2014-06-06 ENCOUNTER — Ambulatory Visit (INDEPENDENT_AMBULATORY_CARE_PROVIDER_SITE_OTHER): Payer: Federal, State, Local not specified - PPO | Admitting: Women's Health

## 2014-06-06 VITALS — BP 148/90 | HR 106 | Wt 172.0 lb

## 2014-06-06 DIAGNOSIS — Z331 Pregnant state, incidental: Secondary | ICD-10-CM

## 2014-06-06 DIAGNOSIS — O10912 Unspecified pre-existing hypertension complicating pregnancy, second trimester: Secondary | ICD-10-CM

## 2014-06-06 DIAGNOSIS — O10919 Unspecified pre-existing hypertension complicating pregnancy, unspecified trimester: Secondary | ICD-10-CM

## 2014-06-06 DIAGNOSIS — O09893 Supervision of other high risk pregnancies, third trimester: Secondary | ICD-10-CM

## 2014-06-06 DIAGNOSIS — Z1389 Encounter for screening for other disorder: Secondary | ICD-10-CM

## 2014-06-06 LAB — POCT URINALYSIS DIPSTICK
GLUCOSE UA: NEGATIVE
KETONES UA: NEGATIVE
Nitrite, UA: NEGATIVE

## 2014-06-06 NOTE — Patient Instructions (Signed)
Call the office ([phone]) or go to [facility] if:  You begin to have strong, frequent contractions  Your water breaks.  Sometimes it is a big gush of fluid, sometimes it is just a trickle that keeps getting your panties wet or running down your legs  You have vaginal bleeding.  It is normal to have a small amount of spotting if your cervix was checked.   You don't feel your baby moving like normal.  If you don't, get you something to eat and drink and lay down and focus on feeling your baby move.  You should feel at least 10 movements in 2 hours.  If you don't, you should call the office or go to [facility].    Tdap Vaccine  It is recommended that you get the Tdap vaccine during the third trimester of EACH pregnancy to help protect your baby from getting pertussis (whooping cough)  27-36 weeks is the BEST time to do this so that you can pass the protection on to your baby. During pregnancy is better than after pregnancy, but if you are unable to get it during pregnancy it will be offered at the hospital.   You can get this vaccine at the health department or your family doctor  Everyone who will be around your baby should also be up-to-date on their vaccines. Adults (who are not pregnant) only need 1 dose of Tdap during adulthood.      Preterm Labor Information Preterm labor is when labor starts at less than 37 weeks of pregnancy. The normal length of a pregnancy is 39 to 41 weeks. CAUSES Often, there is no identifiable underlying cause as to why a woman goes into preterm labor. One of the most common known causes of preterm labor is infection. Infections of the uterus, cervix, vagina, amniotic sac, bladder, kidney, or even the lungs (pneumonia) can cause labor to start. Other suspected causes of preterm labor include:  8. Urogenital infections, such as yeast infections and bacterial vaginosis.  9. Uterine abnormalities (uterine shape, uterine septum, fibroids, or bleeding  from the placenta).  10. A cervix that has been operated on (it may fail to stay closed).  11. Malformations in the fetus.  12. Multiple gestations (twins, triplets, and so on).  13. Breakage of the amniotic sac.  RISK FACTORS 2. Having a previous history of preterm labor.  3. Having premature rupture of membranes (PROM).  4. Having a placenta that covers the opening of the cervix (placenta previa).  5. Having a placenta that separates from the uterus (placental abruption).  6. Having a cervix that is too weak to hold the fetus in the uterus (incompetent cervix).  7. Having too much fluid in the amniotic sac (polyhydramnios).  8. Taking illegal drugs or smoking while pregnant.  9. Not gaining enough weight while pregnant.  10. Being younger than 18 and older than 40 years old.  11. Having a low socioeconomic status.  12. Being African American. SYMPTOMS Signs and symptoms of preterm labor include:   Menstrual-like cramps, abdominal pain, or back pain.  Uterine contractions that are regular, as frequent as six in an hour, regardless of their intensity (may be mild or painful).  Contractions that start on the top of the uterus and spread down to the lower abdomen and back.   A sense of increased pelvic pressure.   A watery or bloody mucus discharge that comes from the vagina.  TREATMENT Depending on the length of the pregnancy and   other circumstances, your health care provider may suggest bed rest. If necessary, there are medicines that can be given to stop contractions and to mature the fetal lungs. If labor happens before 34 weeks of pregnancy, a prolonged hospital stay may be recommended. Treatment depends on the condition of both you and the fetus.  WHAT SHOULD YOU DO IF YOU THINK YOU ARE IN PRETERM LABOR? Call your health care provider right away. You will need to go to the hospital to get checked immediately. HOW CAN YOU PREVENT PRETERM LABOR IN FUTURE  PREGNANCIES? You should:   Stop smoking if you smoke.  Maintain healthy weight gain and avoid chemicals and drugs that are not necessary.  Be watchful for any type of infection.  Inform your health care provider if you have a known history of preterm labor. Document Released: 05/24/2003 Document Revised: 11/03/2012 Document Reviewed: 04/05/2012 [employer] Patient Information 2015 [employer]. This information is not intended to replace advice given to you by your health care provider. Make sure you discuss any questions you have with your health care provider.  

## 2014-06-06 NOTE — Progress Notes (Signed)
High Risk Pregnancy Diagnosis(es): CHTN [id number] 5819w4d Estimated Date of Delivery: 08/04/14 BP 148/90 mmHg  Pulse 106  Wt 172 lb (78.019 kg)  LMP 10/28/2013  Urinalysis: Positive for trace protein HPI:  Doing well overall, some reflux- r/b tums/rolaids. Allergies BP, weight, and urine reviewed.  Reports good fm. Denies regular uc's, lof, vb, uti s/s.   Fundal Height:  30 Fetal Heart rate:  153 Edema:  trace  Reviewed pn2 results, ptl s/s, fkc. Recommended Tdap at HD/PCP per CDC guidelines.  All questions were answered Assessment: 2219w4d CHTN Medication(s) Plans:  Continue labetalol 100mg  bid for now Treatment Plan:  Begin 2x/wk testing on Friday, IOL @ 39wks Follow up in 3d for high-risk OB appt and bpp/afi/efw u/s

## 2014-06-08 ENCOUNTER — Ambulatory Visit (INDEPENDENT_AMBULATORY_CARE_PROVIDER_SITE_OTHER): Payer: Federal, State, Local not specified - PPO

## 2014-06-08 ENCOUNTER — Encounter: Payer: Self-pay | Admitting: Obstetrics & Gynecology

## 2014-06-08 ENCOUNTER — Other Ambulatory Visit: Payer: Self-pay | Admitting: Women's Health

## 2014-06-08 ENCOUNTER — Ambulatory Visit (INDEPENDENT_AMBULATORY_CARE_PROVIDER_SITE_OTHER): Payer: Federal, State, Local not specified - PPO | Admitting: Obstetrics & Gynecology

## 2014-06-08 VITALS — BP 150/90 | Temp 98.4°F | Wt 174.0 lb

## 2014-06-08 DIAGNOSIS — O1493 Unspecified pre-eclampsia, third trimester: Secondary | ICD-10-CM | POA: Diagnosis not present

## 2014-06-08 DIAGNOSIS — O10912 Unspecified pre-existing hypertension complicating pregnancy, second trimester: Secondary | ICD-10-CM

## 2014-06-08 DIAGNOSIS — O10919 Unspecified pre-existing hypertension complicating pregnancy, unspecified trimester: Secondary | ICD-10-CM

## 2014-06-08 DIAGNOSIS — O09523 Supervision of elderly multigravida, third trimester: Secondary | ICD-10-CM

## 2014-06-08 DIAGNOSIS — O09893 Supervision of other high risk pregnancies, third trimester: Secondary | ICD-10-CM

## 2014-06-08 DIAGNOSIS — Z331 Pregnant state, incidental: Secondary | ICD-10-CM

## 2014-06-08 DIAGNOSIS — Z1389 Encounter for screening for other disorder: Secondary | ICD-10-CM

## 2014-06-08 LAB — POCT URINALYSIS DIPSTICK
Blood, UA: 3
Glucose, UA: NEGATIVE
Ketones, UA: NEGATIVE
LEUKOCYTES UA: NEGATIVE
Nitrite, UA: NEGATIVE
Protein, UA: 2

## 2014-06-08 MED ORDER — BETAMETHASONE SOD PHOS & ACET 6 (3-3) MG/ML IJ SUSP
12.0000 mg | Freq: Once | INTRAMUSCULAR | Status: AC
Start: 2014-06-08 — End: 2014-06-08
  Administered 2014-06-08: 12 mg via INTRAMUSCULAR

## 2014-06-08 MED ORDER — LABETALOL HCL 200 MG PO TABS: 200.0000 mg | ORAL_TABLET | Freq: Two times a day (BID) | ORAL | Status: DC

## 2014-06-08 NOTE — Progress Notes (Signed)
Fetal Surveillance Testing today:  None   High Risk Pregnancy Diagnosis(es):   Chronic hypertension now with superimposed preeclampsia  [id number] 9145w6d Estimated Date of Delivery: 08/04/14  Blood pressure 150/90, temperature 98.4 F (36.9 C), weight 174 lb (78.926 kg), last menstrual period 10/28/2013.  Urinalysis: Positive for 2+ protein   HPI: The patient is being seen today for ongoing management of chronic hypertension. Today she reports back and pelvic pressure   BP weight and urine results all reviewed and noted. Patient reports good fetal movement, denies any bleeding and no rupture of membranes symptoms or regular contractions.  Fundal Height:  31 Fetal Heart rate:  144 Edema:  Trace  Patient is without complaints other than noted in her HPI. All questions were answered.  All lab and sonogram results have been reviewed. Comments: abnormal: 2+ protein   Assessment:  1.  Pregnancy at 2645w6d,  Estimated Date of Delivery: 08/04/14 :                          2.  Chronic hypertension                        3.  Superimposed preeclampsia  Medication(s) Plans: Continue labetalol increasing dosing data Methasone the day  Treatment Plan:  Betamethasone 12 mg IM today and [facility]  Follow up in 4 days for appointment for high risk OB care, sonogram

## 2014-06-08 NOTE — Progress Notes (Signed)
US [redacted]W[redacted]D VERTEX,BPP 8/8,afi 12.9cm,,fhr 144bpm,post pLgrade 3,cord doppler RI .67, S/D 3.01, EFW 1782G ([id number]),Biat adnexa wnl

## 2014-06-09 ENCOUNTER — Inpatient Hospital Stay (HOSPITAL_COMMUNITY)
Admission: AD | Admit: 2014-06-09 | Discharge: 2014-06-09 | Disposition: A | Discharge status: Home / Self Care | Payer: Federal, State, Local not specified - PPO | Source: Ambulatory Visit | Attending: Obstetrics & Gynecology | Admitting: Obstetrics & Gynecology

## 2014-06-09 DIAGNOSIS — Z3A33 33 weeks gestation of pregnancy: Secondary | ICD-10-CM | POA: Insufficient documentation

## 2014-06-09 LAB — COMPREHENSIVE METABOLIC PANEL
ALK PHOS: 122 IU/L — AB (ref 39–117)
ALT: 12 IU/L (ref 0–32)
AST: 12 IU/L (ref 0–40)
Albumin/Globulin Ratio: 1.5 (ref 1.1–2.5)
Albumin: 3.5 g/dL (ref 3.5–5.5)
BUN/Creatinine Ratio: 13 (ref 8–20)
BUN: 8 mg/dL (ref 6–20)
Bilirubin Total: <0.2 mg/dL (ref 0.0–1.2)
CALCIUM: 9.6 mg/dL (ref 8.7–10.2)
CHLORIDE: 103 mmol/L (ref 97–108)
CO2: 23 mmol/L (ref 18–29)
CREATININE: 0.6 mg/dL (ref 0.57–1.00)
GFR calc Af Amer: 133 mL/min/{1.73_m2} (ref >59)
GFR calc non Af Amer: 115 mL/min/{1.73_m2} (ref >59)
GLOBULIN, TOTAL: 2.3 g/dL (ref 1.5–4.5)
Glucose: 124 mg/dL — ABNORMAL HIGH (ref 65–99)
POTASSIUM: 3.9 mmol/L (ref 3.5–5.2)
SODIUM: 140 mmol/L (ref 134–144)
Total Protein: 5.8 g/dL — ABNORMAL LOW (ref 6.0–8.5)

## 2014-06-09 LAB — CBC
HCT: 34.3 % (ref 34.0–46.6)
HEMOGLOBIN: 12.1 g/dL (ref 11.1–15.9)
MCH: 31.1 pg (ref 26.6–33.0)
MCHC: 35.3 g/dL (ref 31.5–35.7)
MCV: 88 fL (ref 79–97)
Platelets: 210 10*3/uL (ref 150–379)
RBC: 3.89 x10E6/uL (ref 3.77–5.28)
RDW: 13.1 % (ref 12.3–15.4)
WBC: 15.5 10*3/uL — ABNORMAL HIGH (ref 3.4–10.8)

## 2014-06-09 MED ORDER — BETAMETHASONE SOD PHOS & ACET 6 (3-3) MG/ML IJ SUSP
12.0000 mg | Freq: Once | INTRAMUSCULAR | Status: AC
  Administered 2014-06-09: 12 mg via INTRAMUSCULAR
  Filled 2014-06-09: qty 2

## 2014-06-09 NOTE — MAU Note (Signed)
Pt here for second BMZ per Dr. Despina HiddenEure. No pain or bleeding. Reports good fetal movement.

## 2014-06-12 ENCOUNTER — Other Ambulatory Visit: Payer: Self-pay | Admitting: Obstetrics & Gynecology

## 2014-06-12 ENCOUNTER — Encounter: Payer: Self-pay | Admitting: Obstetrics & Gynecology

## 2014-06-12 ENCOUNTER — Ambulatory Visit (INDEPENDENT_AMBULATORY_CARE_PROVIDER_SITE_OTHER): Payer: Federal, State, Local not specified - PPO | Admitting: Obstetrics & Gynecology

## 2014-06-12 VITALS — BP 150/80 | HR 88 | Wt 173.0 lb

## 2014-06-12 DIAGNOSIS — Z1389 Encounter for screening for other disorder: Secondary | ICD-10-CM

## 2014-06-12 DIAGNOSIS — Z331 Pregnant state, incidental: Secondary | ICD-10-CM

## 2014-06-12 DIAGNOSIS — O10912 Unspecified pre-existing hypertension complicating pregnancy, second trimester: Secondary | ICD-10-CM

## 2014-06-12 DIAGNOSIS — O10919 Unspecified pre-existing hypertension complicating pregnancy, unspecified trimester: Secondary | ICD-10-CM

## 2014-06-12 DIAGNOSIS — O09893 Supervision of other high risk pregnancies, third trimester: Secondary | ICD-10-CM

## 2014-06-12 LAB — POCT URINALYSIS DIPSTICK
Blood, UA: 3
Glucose, UA: NEGATIVE
Ketones, UA: NEGATIVE
LEUKOCYTES UA: NEGATIVE
NITRITE UA: NEGATIVE
Protein, UA: 1

## 2014-06-12 NOTE — Progress Notes (Signed)
Fetal Surveillance Testing today:  Reactive NST   High Risk Pregnancy Diagnosis(es):   Chronic hypertension  G4P2012 1286w3d Estimated Date of Delivery: 08/04/14  Blood pressure 150/80, pulse 88, weight 173 lb (78.472 kg), last menstrual period 10/28/2013.  Urinalysis: Positive for 1+ protein   HPI: The patient is being seen today for ongoing management of Chronic hypertension. Today she reports felling tired   BP weight and urine results all reviewed and noted. Patient reports good fetal movement, denies any bleeding and no rupture of membranes symptoms or regular contractions.  Fundal Height:  33 Fetal Heart rate:  140 Edema:  1+  Patient is without complaints other than noted in her HPI. All questions were answered.  All lab and sonogram results have been reviewed. Comments: normal   Assessment:  1.  Pregnancy at 1886w3d,  Estimated Date of Delivery: 08/04/14 :                          2.  Chronic Hypertension                        3.    Medication(s) Plans:  No changes labetalol 200 BID  Treatment Plan:  Twice weekly surveilance  Follow up in 3 days for appointment for high risk OB care, sonogram Doppler

## 2014-06-13 LAB — PROTEIN, URINE, 24 HOUR
PROTEIN UR: 20 mg/dL — AB (ref 0.0–15.0)
Protein, 24H Urine: 540 mg/24 hr — ABNORMAL HIGH (ref 30.0–150.0)

## 2014-06-14 LAB — US OB FOLLOW UP

## 2014-06-15 ENCOUNTER — Ambulatory Visit (INDEPENDENT_AMBULATORY_CARE_PROVIDER_SITE_OTHER): Payer: Federal, State, Local not specified - PPO | Admitting: Obstetrics & Gynecology

## 2014-06-15 ENCOUNTER — Encounter: Payer: Self-pay | Admitting: Obstetrics & Gynecology

## 2014-06-15 ENCOUNTER — Ambulatory Visit (INDEPENDENT_AMBULATORY_CARE_PROVIDER_SITE_OTHER): Payer: Federal, State, Local not specified - PPO

## 2014-06-15 VITALS — BP 122/80 | HR 92 | Wt 170.0 lb

## 2014-06-15 DIAGNOSIS — O1403 Mild to moderate pre-eclampsia, third trimester: Secondary | ICD-10-CM

## 2014-06-15 DIAGNOSIS — O10912 Unspecified pre-existing hypertension complicating pregnancy, second trimester: Secondary | ICD-10-CM

## 2014-06-15 DIAGNOSIS — Z331 Pregnant state, incidental: Secondary | ICD-10-CM

## 2014-06-15 DIAGNOSIS — O10919 Unspecified pre-existing hypertension complicating pregnancy, unspecified trimester: Secondary | ICD-10-CM

## 2014-06-15 DIAGNOSIS — O09893 Supervision of other high risk pregnancies, third trimester: Secondary | ICD-10-CM

## 2014-06-15 DIAGNOSIS — Z1389 Encounter for screening for other disorder: Secondary | ICD-10-CM

## 2014-06-15 DIAGNOSIS — O09523 Supervision of elderly multigravida, third trimester: Secondary | ICD-10-CM

## 2014-06-15 LAB — POCT URINALYSIS DIPSTICK
Blood, UA: 1
GLUCOSE UA: NEGATIVE
Ketones, UA: NEGATIVE
LEUKOCYTES UA: NEGATIVE
NITRITE UA: NEGATIVE

## 2014-06-15 NOTE — Progress Notes (Signed)
Fetal Surveillance Testing today:  Sonogram BPP 8/8 good Doppler flow   High Risk Pregnancy Diagnosis(es):   Chronic hypertension with superimposed pre eclampsia  [id number] 1048w6d Estimated Date of Delivery: 08/04/14  Blood pressure 122/80, pulse 92, weight 170 lb (77.111 kg), last menstrual period 10/28/2013.  Urinalysis: Negative   HPI: The patient is being seen today for ongoing management of as above. Today she reports no headaches, no blurry vision   BP weight and urine results all reviewed and noted. Patient reports good fetal movement, denies any bleeding and no rupture of membranes symptoms or regular contractions.  Fundal Height:  33 Fetal Heart rate:  146 Edema:  1+  Patient is without complaints other than noted in her HPI. All questions were answered.  All lab and sonogram results have been reviewed. Comments: normal as above on sonogram  Assessment:  1.  Pregnancy at 3048w6d,  Estimated Date of Delivery: 08/04/14 :                          2.  Chronic hypertension with superimposed pre eclampsia                         Medication(s) Plans:  no changes  Treatment Plan:  Twice weekly assessments, sono NST alternating, stop work  Follow up in Monday weeks for appointment for high risk OB care, NST

## 2014-06-15 NOTE — Progress Notes (Signed)
US 375w6d single active fetus,c/w dates, EFW 2101g (4lbs 10 oz)BPP 8/8,vertex,post pl grade 3,AFI 14.8cm,FHT 146bpm,RI .67,S/D 3.04

## 2014-06-19 ENCOUNTER — Encounter: Payer: Self-pay | Admitting: Obstetrics & Gynecology

## 2014-06-19 ENCOUNTER — Ambulatory Visit (INDEPENDENT_AMBULATORY_CARE_PROVIDER_SITE_OTHER): Payer: Federal, State, Local not specified - PPO | Admitting: Obstetrics & Gynecology

## 2014-06-19 VITALS — BP 155/105 | HR 100 | Wt 171.0 lb

## 2014-06-19 DIAGNOSIS — O10913 Unspecified pre-existing hypertension complicating pregnancy, third trimester: Secondary | ICD-10-CM

## 2014-06-19 DIAGNOSIS — O09893 Supervision of other high risk pregnancies, third trimester: Secondary | ICD-10-CM

## 2014-06-19 DIAGNOSIS — Z331 Pregnant state, incidental: Secondary | ICD-10-CM

## 2014-06-19 DIAGNOSIS — Z1389 Encounter for screening for other disorder: Secondary | ICD-10-CM

## 2014-06-19 LAB — POCT URINALYSIS DIPSTICK
Glucose, UA: NEGATIVE
Ketones, UA: NEGATIVE
Nitrite, UA: NEGATIVE

## 2014-06-19 MED ORDER — LABETALOL HCL 200 MG PO TABS: 300.0000 mg | ORAL_TABLET | Freq: Two times a day (BID) | ORAL | Status: DC

## 2014-06-19 NOTE — Addendum Note (Signed)
Addended by: Lazaro ArmsEURE, LUTHER H on: 06/19/2014 04:47 PM   Modules accepted: Orders

## 2014-06-19 NOTE — Progress Notes (Signed)
Fetal Surveillance Testing today:  Reactive NST   High Risk Pregnancy Diagnosis(es):   Chronic Hypertension with superimposed pre eclampsia  [id number] 441w3d Estimated Date of Delivery: 08/04/14  Blood pressure 184/90, pulse 100, weight 171 lb (77.565 kg), last menstrual period 10/28/2013.  Urinalysis: Positive for trace   HPI: The patient is being seen today for ongoing management of as above. Today she reports back ache, pelvic heaviness, some headache today, no visual issues   BP weight and urine results all reviewed and noted. Patient reports good fetal movement, denies any bleeding and no rupture of membranes symptoms or regular contractions.  Fundal Height:  34 Fetal Heart rate:  140 Edema:  1+  Patient is without complaints other than noted in her HPI. All questions were answered.  All lab and sonogram results have been reviewed. Comments: abnormal: 24 hour urine   Assessment:  1.  Pregnancy at 311w3d,  Estimated Date of Delivery: 08/04/14 :                          2.  Chronic hypertension with superimposed pre eclampsia                        3.    Medication(s) Plans:  Increase labetalol to 300 bid  Treatment Plan:  Sonogram on thursday  Follow up in thursday weeks for appointment for high risk OB care, sonogram

## 2014-06-22 ENCOUNTER — Ambulatory Visit (INDEPENDENT_AMBULATORY_CARE_PROVIDER_SITE_OTHER): Payer: Federal, State, Local not specified - PPO | Admitting: Obstetrics & Gynecology

## 2014-06-22 ENCOUNTER — Ambulatory Visit (INDEPENDENT_AMBULATORY_CARE_PROVIDER_SITE_OTHER): Payer: Federal, State, Local not specified - PPO

## 2014-06-22 ENCOUNTER — Encounter: Payer: Self-pay | Admitting: Obstetrics & Gynecology

## 2014-06-22 VITALS — BP 146/90 | HR 96 | Wt 174.0 lb

## 2014-06-22 DIAGNOSIS — O09523 Supervision of elderly multigravida, third trimester: Secondary | ICD-10-CM

## 2014-06-22 DIAGNOSIS — O09893 Supervision of other high risk pregnancies, third trimester: Secondary | ICD-10-CM

## 2014-06-22 DIAGNOSIS — O10913 Unspecified pre-existing hypertension complicating pregnancy, third trimester: Secondary | ICD-10-CM

## 2014-06-22 DIAGNOSIS — O1403 Mild to moderate pre-eclampsia, third trimester: Secondary | ICD-10-CM

## 2014-06-22 DIAGNOSIS — Z331 Pregnant state, incidental: Secondary | ICD-10-CM

## 2014-06-22 DIAGNOSIS — Z1389 Encounter for screening for other disorder: Secondary | ICD-10-CM

## 2014-06-22 NOTE — Progress Notes (Signed)
Fetal Surveillance Testing today:  Sonogram: reassuring BPP, good fluid and good Doppler flow   High Risk Pregnancy Diagnosis(es):   Chronic Hypertension with superimposed pre eclampsia  [id number] 5659w6d Estimated Date of Delivery: 08/04/14  Blood pressure 146/90, pulse 96, weight 174 lb (78.926 kg), last menstrual period 10/28/2013.  Urinalysis: Negative   HPI: The patient is being seen today for ongoing management of as above. Today she reports no problems  BP weight and urine results all reviewed and noted. Patient reports good fetal movement, denies any bleeding and no rupture of membranes symptoms or regular contractions.  Fundal Height:  145 Fetal Heart rate:  34 Edema:  none  Patient is without complaints other than noted in her HPI. All questions were answered.  All lab and sonogram results have been reviewed. Comments: normal sonogram today  Assessment:  1.  Pregnancy at 3959w6d,  Estimated Date of Delivery: 08/04/14 :                          2.  Chronic hypertension with superimposed hypertension                        3.    Medication(s) Plans:  Labetalol 300 BID  Treatment Plan:  Twice weekly assessments  Follow up in Monday weeks for appointment for high risk OB care, NST

## 2014-06-22 NOTE — Progress Notes (Signed)
US BPP 8/8,AFI 15.4CM,cephalic,cx appears closed, RI.68 ,S/D 3.14,adnexa wnl,post pl grade 3

## 2014-06-26 ENCOUNTER — Encounter: Payer: Self-pay | Admitting: Obstetrics & Gynecology

## 2014-06-26 ENCOUNTER — Other Ambulatory Visit: Payer: Self-pay | Admitting: Women's Health

## 2014-06-26 ENCOUNTER — Ambulatory Visit (INDEPENDENT_AMBULATORY_CARE_PROVIDER_SITE_OTHER): Payer: Federal, State, Local not specified - PPO | Admitting: Women's Health

## 2014-06-26 VITALS — BP 130/90 | HR 92 | Wt 174.0 lb

## 2014-06-26 DIAGNOSIS — O10912 Unspecified pre-existing hypertension complicating pregnancy, second trimester: Secondary | ICD-10-CM | POA: Diagnosis not present

## 2014-06-26 DIAGNOSIS — O09523 Supervision of elderly multigravida, third trimester: Secondary | ICD-10-CM

## 2014-06-26 DIAGNOSIS — O10919 Unspecified pre-existing hypertension complicating pregnancy, unspecified trimester: Secondary | ICD-10-CM

## 2014-06-26 DIAGNOSIS — O09893 Supervision of other high risk pregnancies, third trimester: Secondary | ICD-10-CM

## 2014-06-26 DIAGNOSIS — O119 Pre-existing hypertension with pre-eclampsia, unspecified trimester: Secondary | ICD-10-CM

## 2014-06-26 DIAGNOSIS — Z1389 Encounter for screening for other disorder: Secondary | ICD-10-CM

## 2014-06-26 DIAGNOSIS — Z331 Pregnant state, incidental: Secondary | ICD-10-CM

## 2014-06-26 LAB — POCT URINALYSIS DIPSTICK
Glucose, UA: NEGATIVE
Ketones, UA: NEGATIVE
Leukocytes, UA: NEGATIVE
NITRITE UA: NEGATIVE
Protein, UA: 1
RBC UA: 2

## 2014-06-26 NOTE — Patient Instructions (Signed)
Protonix, nexium, prilosec for heartburn  Tips to Help You Sleep Better:   Get into a bedtime routine, try to do the same thing every night before going to bed to try to help your body wind down  Warm baths  Avoid caffeine for at least 3 hours before going to sleep   Keep your room at a slightly cooler temperature, can try running a fan  Turn off TV, lights, phone, electronics  Lots of pillows if needed to help you get comfortable  Lavender scented items can help you sleep. You can place lavender essential oil on a cotton ball and place under your pillowcase, or place in a diffuser. [employer] has a lavender scented sleep line (plug-ins, sprays, etc). Look in the pillow aisle for lavender scented pillows.   If none of the above things help, you can try 1/2 of a benadryl, unisom, or tylenol pm. Do not take this every night, only when you really need it.    Call the office [phone]) or [facility] if:  You begin to have strong, frequent contractions  Your water breaks.  Sometimes it is a big gush of fluid, sometimes it is just a trickle that keeps getting your panties wet or running down your legs  You have vaginal bleeding.  It is normal to have a small amount of spotting if your cervix was checked.   You don't feel your baby moving like normal.  If you don't, get you something to eat and drink and lay down and focus on feeling your baby move.  You should feel at least 10 movements in 2 hours.  If you don't, you should call the office or go to [facility].    Call the office [phone]) or [facility] for these signs of pre-eclampsia:  Severe headache that does not go away with Tylenol  Visual changes- seeing spots, double, blurred vision  Pain under your right breast or upper abdomen that does not go away with Tums or heartburn medicine  Nausea and/or vomiting  Severe swelling in your hands, feet, and face    Preterm Labor Information Preterm labor  is when labor starts at less than 37 weeks of pregnancy. The normal length of a pregnancy is 39 to 41 weeks. CAUSES Often, there is no identifiable underlying cause as to why a woman goes into preterm labor. One of the most common known causes of preterm labor is infection. Infections of the uterus, cervix, vagina, amniotic sac, bladder, kidney, or even the lungs (pneumonia) can cause labor to start. Other suspected causes of preterm labor include:  18. Urogenital infections, such as yeast infections and bacterial vaginosis.  19. Uterine abnormalities (uterine shape, uterine septum, fibroids, or bleeding from the placenta).  20. A cervix that has been operated on (it may fail to stay closed).  21. Malformations in the fetus.  22. Multiple gestations (twins, triplets, and so on).  23. Breakage of the amniotic sac.  RISK FACTORS  Having a previous history of preterm labor.   Having premature rupture of membranes (PROM).   Having a placenta that covers the opening of the cervix (placenta previa).   Having a placenta that separates from the uterus (placental abruption).   Having a cervix that is too weak to hold the fetus in the uterus (incompetent cervix).   Having too much fluid in the amniotic sac (polyhydramnios).   Taking illegal drugs or smoking while pregnant.   Not gaining enough weight while  pregnant.   Being younger than 59 and older than 40 years old.   Having a low socioeconomic status.   Being African American. SYMPTOMS Signs and symptoms of preterm labor include:   Menstrual-like cramps, abdominal pain, or back pain.  Uterine contractions that are regular, as frequent as six in an hour, regardless of their intensity (may be mild or painful).  Contractions that start on the top of the uterus and spread down to the lower abdomen and back.   A sense of increased pelvic pressure.   A watery or bloody mucus discharge that comes from the vagina.   TREATMENT Depending on the length of the pregnancy and other circumstances, your health care provider may suggest bed rest. If necessary, there are medicines that can be given to stop contractions and to mature the fetal lungs. If labor happens before 34 weeks of pregnancy, a prolonged hospital stay may be recommended. Treatment depends on the condition of both you and the fetus.  WHAT SHOULD YOU DO IF YOU THINK YOU ARE IN PRETERM LABOR? Call your health care provider right away. You will need to go to the hospital to get checked immediately. HOW CAN YOU PREVENT PRETERM LABOR IN FUTURE PREGNANCIES? You should:   Stop smoking if you smoke.  Maintain healthy weight gain and avoid chemicals and drugs that are not necessary.  Be watchful for any type of infection.  Inform your health care provider if you have a known history of preterm labor. Document Released: 05/24/2003 Document Revised: 11/03/2012 Document Reviewed: 04/05/2012 [facility] Patient Information 2015 [place], [place]. This information is not intended to replace advice given to you by your health care provider. Make sure you discuss any questions you have with your health care provider.

## 2014-06-26 NOTE — Addendum Note (Signed)
Addended by: Criss AlvinePULLIAM, CHRYSTAL G on: 06/26/2014 06:04 PM   Modules accepted: Orders

## 2014-06-26 NOTE — Progress Notes (Signed)
High Risk Pregnancy Diagnosis(es): CHTN w/ superimposed pre-e [id number] 5752w3d Estimated Date of Delivery: 08/04/14 BP 130/90 mmHg  Pulse 92  Wt 174 lb (78.926 kg)  LMP 10/28/2013  Urinalysis: Positive for 1+ protein HPI:  Doing well, mild occ ha r/t nasal congestion- not enough to take apap. Denies scotomata, ruq/epigastric pain, n/v.  Heartburn- tums not helping- can take otc antacid. Not sleeping well- gave tips.  BP, weight, and urine reviewed.  Reports good fm. Denies regular uc's, lof, vb, uti s/s.   Fundal Height:  33 Fetal Heart rate:  140, reactive nst Edema:  Trace DTRs 2+, no clonus  Reviewed ptl s/s, pre-e s/s, fkc All questions were answered Assessment: 9052w3d CHTN w/ SI Pre-e Medication(s) Plans:   Continue labetalol 300mg  BID Treatment Plan:  Recheck pre-e labs today, u/s and visit in 3d, IOL @ 37wks or earlier if indicated Follow up in 3d for high-risk OB appt and efw/afi/dopp/bpp u/s

## 2014-06-27 LAB — COMPREHENSIVE METABOLIC PANEL
ALBUMIN: 3.6 g/dL (ref 3.5–5.5)
ALK PHOS: 168 IU/L — AB (ref 39–117)
ALT: 21 IU/L (ref 0–32)
AST: 17 IU/L (ref 0–40)
Albumin/Globulin Ratio: 1.5 (ref 1.1–2.5)
BUN / CREAT RATIO: 18 (ref 8–20)
BUN: 6 mg/dL (ref 6–20)
Bilirubin Total: <0.2 mg/dL (ref 0.0–1.2)
CHLORIDE: 102 mmol/L (ref 97–108)
CO2: 20 mmol/L (ref 18–29)
Calcium: 9.6 mg/dL (ref 8.7–10.2)
Creatinine, Ser: 0.34 mg/dL — ABNORMAL LOW (ref 0.57–1.00)
GFR calc Af Amer: 160 mL/min/{1.73_m2} (ref >59)
GFR calc non Af Amer: 139 mL/min/{1.73_m2} (ref >59)
Globulin, Total: 2.4 g/dL (ref 1.5–4.5)
Glucose: 85 mg/dL (ref 65–99)
POTASSIUM: 4.4 mmol/L (ref 3.5–5.2)
Sodium: 139 mmol/L (ref 134–144)
Total Protein: 6 g/dL (ref 6.0–8.5)

## 2014-06-27 LAB — CBC
HEMATOCRIT: 39.1 % (ref 34.0–46.6)
HEMOGLOBIN: 13.1 g/dL (ref 11.1–15.9)
MCH: 30.8 pg (ref 26.6–33.0)
MCHC: 33.5 g/dL (ref 31.5–35.7)
MCV: 92 fL (ref 79–97)
Platelets: 218 10*3/uL (ref 150–379)
RBC: 4.25 x10E6/uL (ref 3.77–5.28)
RDW: 13.7 % (ref 12.3–15.4)
WBC: 14.2 10*3/uL — ABNORMAL HIGH (ref 3.4–10.8)

## 2014-06-27 LAB — PROTEIN / CREATININE RATIO, URINE
Creatinine, Urine: 87.8 mg/dL (ref 16.0–327.0)
PROTEIN UR: 44.5 mg/dL — AB (ref 0.0–15.0)
Protein/Creat Ratio: 507 mg/g creat — ABNORMAL HIGH (ref 0–200)

## 2014-06-29 ENCOUNTER — Ambulatory Visit (INDEPENDENT_AMBULATORY_CARE_PROVIDER_SITE_OTHER): Payer: Federal, State, Local not specified - PPO | Admitting: Obstetrics & Gynecology

## 2014-06-29 ENCOUNTER — Encounter: Payer: Self-pay | Admitting: Obstetrics & Gynecology

## 2014-06-29 ENCOUNTER — Ambulatory Visit (INDEPENDENT_AMBULATORY_CARE_PROVIDER_SITE_OTHER): Payer: Federal, State, Local not specified - PPO

## 2014-06-29 ENCOUNTER — Encounter: Payer: Federal, State, Local not specified - PPO | Admitting: Advanced Practice Midwife

## 2014-06-29 VITALS — BP 140/90 | HR 92 | Wt 175.0 lb

## 2014-06-29 DIAGNOSIS — O09523 Supervision of elderly multigravida, third trimester: Secondary | ICD-10-CM

## 2014-06-29 DIAGNOSIS — Z1389 Encounter for screening for other disorder: Secondary | ICD-10-CM

## 2014-06-29 DIAGNOSIS — O09893 Supervision of other high risk pregnancies, third trimester: Secondary | ICD-10-CM | POA: Diagnosis not present

## 2014-06-29 DIAGNOSIS — O119 Pre-existing hypertension with pre-eclampsia, unspecified trimester: Secondary | ICD-10-CM

## 2014-06-29 DIAGNOSIS — Z331 Pregnant state, incidental: Secondary | ICD-10-CM

## 2014-06-29 DIAGNOSIS — O159 Eclampsia, unspecified as to time period: Secondary | ICD-10-CM

## 2014-06-29 DIAGNOSIS — Z029 Encounter for administrative examinations, unspecified: Secondary | ICD-10-CM

## 2014-06-29 LAB — POCT URINALYSIS DIPSTICK
GLUCOSE UA: NEGATIVE
Ketones, UA: NEGATIVE
Nitrite, UA: NEGATIVE
Protein, UA: NEGATIVE

## 2014-06-29 NOTE — Progress Notes (Signed)
Fetal Surveillance Testing today:  Sonogram with reassuring fetal status, see report   High Risk Pregnancy Diagnosis(es):   CHTN with superimposed pre eclmapsia  Z6X0960G4P2012 10975w6d Estimated Date of Delivery: 08/04/14  Blood pressure 140/90, pulse 92, weight 175 lb (79.379 kg), last menstrual period 10/28/2013.  Urinalysis: Negative   HPI: The patient is being seen today for ongoing management of as above. Today she reports back ache pelvic pressure, seems to be having seasonal allergies   BP weight and urine results all reviewed and noted. Patient reports good fetal movement, denies any bleeding and no rupture of membranes symptoms or regular contractions.  Fundal Height:  34 Fetal Heart rate:  130 Edema:  none  Patient is without complaints other than noted in her HPI. All questions were answered.  All lab and sonogram results have been reviewed. Comments: abnormal :  Pr/Cr .50683(no decimal point)  Assessment:  1.  Pregnancy at 2275w6d,  Estimated Date of Delivery: 08/04/14 :                          2.  Chronic hypertension                        3.  Superimposed pre eclampsia  Medication(s) Plans:  Continue labetalol 300 BID  Treatment Plan:  twcie weekly assessments, induce as clinically indicated or 37 weeks  Follow up in keep weeks for appointment for high risk OB care, NST

## 2014-06-29 NOTE — Progress Notes (Signed)
US 2376w6d c/w dates,cephalic,afi 11.1cm/fht 130,BPP8/8,post pl grade 3,RI .60, S/D 2.46,2258g (5lbs)

## 2014-07-03 ENCOUNTER — Ambulatory Visit (INDEPENDENT_AMBULATORY_CARE_PROVIDER_SITE_OTHER): Payer: Federal, State, Local not specified - PPO | Admitting: Obstetrics & Gynecology

## 2014-07-03 ENCOUNTER — Other Ambulatory Visit: Payer: Federal, State, Local not specified - PPO

## 2014-07-03 ENCOUNTER — Other Ambulatory Visit: Payer: Federal, State, Local not specified - PPO | Admitting: Obstetrics & Gynecology

## 2014-07-03 ENCOUNTER — Encounter: Payer: Self-pay | Admitting: Obstetrics & Gynecology

## 2014-07-03 VITALS — BP 118/80 | HR 80 | Wt 173.0 lb

## 2014-07-03 DIAGNOSIS — O1213 Gestational proteinuria, third trimester: Secondary | ICD-10-CM

## 2014-07-03 DIAGNOSIS — O159 Eclampsia, unspecified as to time period: Secondary | ICD-10-CM | POA: Diagnosis not present

## 2014-07-03 DIAGNOSIS — O119 Pre-existing hypertension with pre-eclampsia, unspecified trimester: Secondary | ICD-10-CM

## 2014-07-03 DIAGNOSIS — O10913 Unspecified pre-existing hypertension complicating pregnancy, third trimester: Secondary | ICD-10-CM

## 2014-07-03 DIAGNOSIS — Z331 Pregnant state, incidental: Secondary | ICD-10-CM

## 2014-07-03 DIAGNOSIS — O09893 Supervision of other high risk pregnancies, third trimester: Secondary | ICD-10-CM

## 2014-07-03 DIAGNOSIS — Z1389 Encounter for screening for other disorder: Secondary | ICD-10-CM

## 2014-07-03 LAB — POCT URINALYSIS DIPSTICK
GLUCOSE UA: NEGATIVE
Ketones, UA: NEGATIVE
Leukocytes, UA: NEGATIVE
Nitrite, UA: NEGATIVE

## 2014-07-03 NOTE — Progress Notes (Signed)
Fetal Surveillance Testing today:  Reactive NST   High Risk Pregnancy Diagnosis(es):   C HTN with superimposed mild pre eclmapsia  [id number] 8166w3d Estimated Date of Delivery: 08/04/14  Blood pressure 118/80, pulse 80, weight 173 lb (78.472 kg), last menstrual period 10/28/2013.  Urinalysis: Positive for 2+ protein   HPI: The patient is being seen today for ongoing management of as above . Today she reports BH contractions last night   BP weight and urine results all reviewed and noted. Patient reports good fetal movement, denies any bleeding and no rupture of membranes symptoms or regular contractions.  Fundal Height:  34 Fetal Heart rate:  140 Edema:  1+  Patient is without complaints other than noted in her HPI. All questions were answered.  All lab and sonogram results have been reviewed. Comments: abnormal: elevated Pr/Cr ratio   Assessment:  1.  Pregnancy at 2266w3d,  Estimated Date of Delivery: 08/04/14 :                          2.  Chronic hypertension                        3.  Superimposed pre eclampsia  Medication(s) Plans:  No changes, BP ok  Treatment Plan:  Twice weekly assessments, sono alt NST, induce at 37 weeks: 07/14/2014  Follow up in thursday weeks for appointment for high risk OB care, sonogram  Estimated Date of Delivery: 08/04/14

## 2014-07-03 NOTE — Addendum Note (Signed)
Addended by: Colen DarlingYOUNG, Lazette Estala S on: 07/03/2014 11:57 AM   Modules accepted: Orders

## 2014-07-04 ENCOUNTER — Telehealth (HOSPITAL_COMMUNITY): Payer: Self-pay | Admitting: *Deleted

## 2014-07-04 LAB — PROTEIN / CREATININE RATIO, URINE
CREATININE, UR: 158.4 mg/dL (ref 16.0–327.0)
PROTEIN/CREAT RATIO: 473 mg/g{creat} — AB (ref 0–200)
Protein, Ur: 75 mg/dL — ABNORMAL HIGH (ref 0.0–15.0)

## 2014-07-04 NOTE — Telephone Encounter (Signed)
Preadmission screen  

## 2014-07-06 ENCOUNTER — Encounter: Payer: Self-pay | Admitting: Obstetrics and Gynecology

## 2014-07-06 ENCOUNTER — Ambulatory Visit (INDEPENDENT_AMBULATORY_CARE_PROVIDER_SITE_OTHER): Payer: Federal, State, Local not specified - PPO | Admitting: Obstetrics and Gynecology

## 2014-07-06 ENCOUNTER — Ambulatory Visit (INDEPENDENT_AMBULATORY_CARE_PROVIDER_SITE_OTHER): Payer: Federal, State, Local not specified - PPO

## 2014-07-06 VITALS — BP 130/90 | HR 88 | Wt 176.0 lb

## 2014-07-06 DIAGNOSIS — O159 Eclampsia, unspecified as to time period: Secondary | ICD-10-CM

## 2014-07-06 DIAGNOSIS — Z331 Pregnant state, incidental: Secondary | ICD-10-CM

## 2014-07-06 DIAGNOSIS — O09893 Supervision of other high risk pregnancies, third trimester: Secondary | ICD-10-CM

## 2014-07-06 DIAGNOSIS — Z1389 Encounter for screening for other disorder: Secondary | ICD-10-CM

## 2014-07-06 DIAGNOSIS — O09523 Supervision of elderly multigravida, third trimester: Secondary | ICD-10-CM

## 2014-07-06 DIAGNOSIS — O10913 Unspecified pre-existing hypertension complicating pregnancy, third trimester: Secondary | ICD-10-CM

## 2014-07-06 DIAGNOSIS — O119 Pre-existing hypertension with pre-eclampsia, unspecified trimester: Secondary | ICD-10-CM

## 2014-07-06 LAB — POCT URINALYSIS DIPSTICK
Blood, UA: NEGATIVE
GLUCOSE UA: NEGATIVE
GLUCOSE UA: NEGATIVE
KETONES UA: NEGATIVE
LEUKOCYTES UA: NEGATIVE
NITRITE UA: NEGATIVE
Protein, UA: 1
Protein, UA: NEGATIVE

## 2014-07-06 NOTE — Progress Notes (Signed)
Pt denies any problems or concerns at this time.  

## 2014-07-06 NOTE — Progress Notes (Signed)
US BPP 8/8,post pl grade 3, afi 13.1cm,cephalic, RI .62,S/D 2.64,bilat adnexa wnl,cx 2.8cm,fht138bpm

## 2014-07-06 NOTE — Progress Notes (Signed)
Patient ID: Sherry Garrett, female   DOB: 1975-12-16, 39 y.o.   MRN: [id number] Fetal Surveillance Testing today:  BPP, dopplers   High Risk Pregnancy Diagnosis(es):   CHTN with superimposed mild Pre-E  [id number] 238w6d Estimated Date of Delivery: 08/04/14  Blood pressure 130/90, pulse 88, weight 176 lb (79.833 kg), last menstrual period 10/28/2013.  Urinalysis: Positive for 2+ proteinuria.   HPI: The patient is being seen today for ongoing management of CHTN/ PRE-E MILD. Today she reports no h/a, scotoma, ruq pain.   BP weight and urine results all reviewed and noted.130/90 Patient reports good fetal movement, denies any bleeding and no rupture of membranes symptoms or regular contractions.  Fundal Height:  C/w dates. AFI 13, RI .60.                           Growth normal by scan last wk.  Fetal Heart rate:  normal Edema:  1+ Reflexes 3+ without clonus  Patient is without complaints other than noted in her HPI. All questions were answered. Pt already scheduled for NST alternating with BPP/dopplers. IOL scheduled for 37 wk AND sonogram results have been reviewed.   Assessment:  1.  Pregnancy at 858w6d,  Estimated Date of Delivery: 08/04/14 :  For IOL at 37 wk or prn deterioration                        2.  Continue current Labetalol 300 bid                        3.  NST monday  Medication(s) Plans:  No change  Treatment Plan:  As above.  Follow up in 0.5 weeks for appointment for high risk OB care, NST.

## 2014-07-10 ENCOUNTER — Other Ambulatory Visit: Payer: Federal, State, Local not specified - PPO

## 2014-07-10 ENCOUNTER — Telehealth: Payer: Self-pay | Admitting: Obstetrics & Gynecology

## 2014-07-10 ENCOUNTER — Other Ambulatory Visit: Payer: Federal, State, Local not specified - PPO | Admitting: Obstetrics & Gynecology

## 2014-07-10 ENCOUNTER — Telehealth: Payer: Self-pay | Admitting: *Deleted

## 2014-07-10 ENCOUNTER — Encounter: Payer: Self-pay | Admitting: Obstetrics & Gynecology

## 2014-07-10 ENCOUNTER — Ambulatory Visit (INDEPENDENT_AMBULATORY_CARE_PROVIDER_SITE_OTHER): Payer: Federal, State, Local not specified - PPO | Admitting: Obstetrics & Gynecology

## 2014-07-10 VITALS — BP 134/88 | HR 88 | Wt 177.5 lb

## 2014-07-10 DIAGNOSIS — O159 Eclampsia, unspecified as to time period: Secondary | ICD-10-CM | POA: Diagnosis not present

## 2014-07-10 DIAGNOSIS — O10913 Unspecified pre-existing hypertension complicating pregnancy, third trimester: Secondary | ICD-10-CM | POA: Diagnosis not present

## 2014-07-10 DIAGNOSIS — Z369 Encounter for antenatal screening, unspecified: Secondary | ICD-10-CM

## 2014-07-10 DIAGNOSIS — O09893 Supervision of other high risk pregnancies, third trimester: Secondary | ICD-10-CM

## 2014-07-10 DIAGNOSIS — Z1389 Encounter for screening for other disorder: Secondary | ICD-10-CM

## 2014-07-10 DIAGNOSIS — Z331 Pregnant state, incidental: Secondary | ICD-10-CM

## 2014-07-10 DIAGNOSIS — O119 Pre-existing hypertension with pre-eclampsia, unspecified trimester: Secondary | ICD-10-CM

## 2014-07-10 LAB — POCT URINALYSIS DIPSTICK
Glucose, UA: NEGATIVE
Ketones, UA: NEGATIVE
Nitrite, UA: NEGATIVE

## 2014-07-10 LAB — OB RESULTS CONSOLE GC/CHLAMYDIA
Chlamydia: NEGATIVE
GC PROBE AMP, GENITAL: NEGATIVE

## 2014-07-10 MED ORDER — HYDROCORTISONE 2.5 % RE CREA: 1.0000 "application " | TOPICAL_CREAM | Freq: Two times a day (BID) | RECTAL | Status: DC

## 2014-07-10 NOTE — Progress Notes (Signed)
Fetal Surveillance Testing today:  Reactive NST   High Risk Pregnancy Diagnosis(es):   CHTN with superimposed pre eclampsia  [id number] 297w3d Estimated Date of Delivery: 08/04/14  Blood pressure 134/88, pulse 88, weight 177 lb 8 oz (80.513 kg), last menstrual period 10/28/2013.  Urinalysis: Negative   HPI: The patient is being seen today for ongoing management of CHTN with superimposed pre eclampsia. Today she reports hemorrhoidal pian   BP weight and urine results all reviewed and noted. Patient reports good fetal movement, denies any bleeding and no rupture of membranes symptoms or regular contractions.  Fundal Height:  36 Fetal Heart rate:  145  Edema:  trace  Patient is without complaints other than noted in her HPI. All questions were answered.  All lab and sonogram results have been reviewed. Comments:   Assessment:  1.  Pregnancy at 237w3d,  Estimated Date of Delivery: 08/04/14 :                          2.  CHTN with superimposed pre eclampsia                        3.    Medication(s) Plans:  Labetalol 300 BID  Treatment Plan:  Twice weekly and induction next week  Follow up in thursday weeks for appointment for high risk OB care, sonogram

## 2014-07-10 NOTE — Telephone Encounter (Signed)
Spoke with pt. Pt was wondering if you was going to order something for her hemorrhoids. Please advise. Thanks!! JSY

## 2014-07-10 NOTE — Telephone Encounter (Signed)
Pt aware Anusol was sent to pharmacy. JSY

## 2014-07-11 LAB — GC/CHLAMYDIA PROBE AMP
Chlamydia trachomatis, NAA: NEGATIVE
Neisseria gonorrhoeae by PCR: NEGATIVE

## 2014-07-13 ENCOUNTER — Ambulatory Visit (INDEPENDENT_AMBULATORY_CARE_PROVIDER_SITE_OTHER): Payer: Federal, State, Local not specified - PPO

## 2014-07-13 ENCOUNTER — Ambulatory Visit (INDEPENDENT_AMBULATORY_CARE_PROVIDER_SITE_OTHER): Payer: Federal, State, Local not specified - PPO | Admitting: Obstetrics & Gynecology

## 2014-07-13 VITALS — BP 144/88 | HR 84 | Wt 179.0 lb

## 2014-07-13 DIAGNOSIS — O09523 Supervision of elderly multigravida, third trimester: Secondary | ICD-10-CM

## 2014-07-13 DIAGNOSIS — O159 Eclampsia, unspecified as to time period: Secondary | ICD-10-CM

## 2014-07-13 DIAGNOSIS — Z331 Pregnant state, incidental: Secondary | ICD-10-CM

## 2014-07-13 DIAGNOSIS — O09893 Supervision of other high risk pregnancies, third trimester: Secondary | ICD-10-CM

## 2014-07-13 DIAGNOSIS — Z1389 Encounter for screening for other disorder: Secondary | ICD-10-CM

## 2014-07-13 DIAGNOSIS — O119 Pre-existing hypertension with pre-eclampsia, unspecified trimester: Secondary | ICD-10-CM

## 2014-07-13 LAB — POCT URINALYSIS DIPSTICK
Blood, UA: 3
GLUCOSE UA: NEGATIVE
KETONES UA: NEGATIVE
LEUKOCYTES UA: NEGATIVE
Nitrite, UA: NEGATIVE
Protein, UA: 1

## 2014-07-13 NOTE — Progress Notes (Signed)
Fetal Surveillance Testing today:  Sonogram is reviewed and reassuring   High Risk Pregnancy Diagnosis(es):   CHTN with super imposed pre eclampsia  [id number] 5793w6d Estimated Date of Delivery: 08/04/14  Blood pressure 144/88, pulse 84, weight 179 lb (81.194 kg), last menstrual period 10/28/2013.  Urinalysis: Negative   HPI: The patient is being seen today for ongoing management of chronic hypertension with pre eclampsia. Today she reports pelvic pressure   BP weight and urine results all reviewed and noted. Patient reports good fetal movement, denies any bleeding and no rupture of membranes symptoms or regular contractions.  Fundal Height:  35 Fetal Heart rate:  129 Edema:  1+  Patient is without complaints other than noted in her HPI. All questions were answered.  All lab and sonogram results have been reviewed. Comments: normal   Assessment:  1.  Pregnancy at 4093w6d,  Estimated Date of Delivery: 08/04/14 :                          2.  CHTN with super imposed pre eclampsia                        3.    Medication(s) Plans:  Labetalol 300 BID  Treatment Plan:  Induce tomorrow  Follow up in 2 weeks for appointment for high risk OB care, pp bp check

## 2014-07-13 NOTE — Progress Notes (Signed)
US 36+6w EFW 2485G ([id number]) 12.6%,cephalic,post pl ZO1,WRUEAVgr3,normal ov's bilat,afi 10.25,RI .65,S/D 2.87,FHT 129BPM,BPP 8/8

## 2014-07-14 ENCOUNTER — Inpatient Hospital Stay (HOSPITAL_COMMUNITY)
Admission: RE | Admit: 2014-07-14 | Discharge: 2014-07-16 | DRG: 774 | Disposition: A | Discharge status: Home / Self Care | Payer: Federal, State, Local not specified - PPO | Source: Ambulatory Visit | Attending: Obstetrics and Gynecology | Admitting: Obstetrics and Gynecology

## 2014-07-14 ENCOUNTER — Inpatient Hospital Stay (HOSPITAL_COMMUNITY): Payer: Federal, State, Local not specified - PPO | Admitting: Anesthesiology

## 2014-07-14 ENCOUNTER — Encounter (HOSPITAL_COMMUNITY): Payer: Self-pay

## 2014-07-14 VITALS — BP 149/86 | HR 79 | Temp 98.0°F | Resp 18 | Ht 62.0 in | Wt 179.0 lb

## 2014-07-14 DIAGNOSIS — O99334 Smoking (tobacco) complicating childbirth: Secondary | ICD-10-CM | POA: Diagnosis present

## 2014-07-14 DIAGNOSIS — O113 Pre-existing hypertension with pre-eclampsia, third trimester: Secondary | ICD-10-CM | POA: Diagnosis present

## 2014-07-14 DIAGNOSIS — O10013 Pre-existing essential hypertension complicating pregnancy, third trimester: Secondary | ICD-10-CM | POA: Diagnosis present

## 2014-07-14 DIAGNOSIS — Z3A37 37 weeks gestation of pregnancy: Secondary | ICD-10-CM | POA: Diagnosis present

## 2014-07-14 DIAGNOSIS — F17210 Nicotine dependence, cigarettes, uncomplicated: Secondary | ICD-10-CM | POA: Diagnosis present

## 2014-07-14 DIAGNOSIS — O09523 Supervision of elderly multigravida, third trimester: Secondary | ICD-10-CM | POA: Diagnosis not present

## 2014-07-14 DIAGNOSIS — Z823 Family history of stroke: Secondary | ICD-10-CM

## 2014-07-14 DIAGNOSIS — O09893 Supervision of other high risk pregnancies, third trimester: Secondary | ICD-10-CM

## 2014-07-14 DIAGNOSIS — Z833 Family history of diabetes mellitus: Secondary | ICD-10-CM | POA: Diagnosis not present

## 2014-07-14 DIAGNOSIS — Z8249 Family history of ischemic heart disease and other diseases of the circulatory system: Secondary | ICD-10-CM | POA: Diagnosis not present

## 2014-07-14 DIAGNOSIS — O1490 Unspecified pre-eclampsia, unspecified trimester: Secondary | ICD-10-CM | POA: Diagnosis present

## 2014-07-14 LAB — TYPE AND SCREEN
ABO/RH(D): O POS
Antibody Screen: NEGATIVE

## 2014-07-14 LAB — COMPREHENSIVE METABOLIC PANEL
ALK PHOS: 169 U/L — AB (ref 39–117)
ALT: 20 U/L (ref 0–35)
ANION GAP: 7 (ref 5–15)
AST: 22 U/L (ref 0–37)
Albumin: 2.7 g/dL — ABNORMAL LOW (ref 3.5–5.2)
BILIRUBIN TOTAL: 0.1 mg/dL — AB (ref 0.3–1.2)
BUN: 8 mg/dL (ref 6–23)
CO2: 21 mmol/L (ref 19–32)
Calcium: 8.6 mg/dL (ref 8.4–10.5)
Chloride: 105 mmol/L (ref 96–112)
Creatinine, Ser: 0.48 mg/dL — ABNORMAL LOW (ref 0.50–1.10)
GFR calc Af Amer: >90 mL/min (ref >90)
GLUCOSE: 104 mg/dL — AB (ref 70–99)
POTASSIUM: 4 mmol/L (ref 3.5–5.1)
SODIUM: 133 mmol/L — AB (ref 135–145)
Total Protein: 5.7 g/dL — ABNORMAL LOW (ref 6.0–8.3)

## 2014-07-14 LAB — CBC
HEMATOCRIT: 34.3 % — AB (ref 36.0–46.0)
Hemoglobin: 12.2 g/dL (ref 12.0–15.0)
MCH: 31.4 pg (ref 26.0–34.0)
MCHC: 35.6 g/dL (ref 30.0–36.0)
MCV: 88.2 fL (ref 78.0–100.0)
Platelets: 204 10*3/uL (ref 150–400)
RBC: 3.89 MIL/uL (ref 3.87–5.11)
RDW: 13.3 % (ref 11.5–15.5)
WBC: 15.1 10*3/uL — ABNORMAL HIGH (ref 4.0–10.5)

## 2014-07-14 LAB — RPR: RPR Ser Ql: NONREACTIVE

## 2014-07-14 LAB — OB RESULTS CONSOLE GBS: GBS: NEGATIVE

## 2014-07-14 LAB — ABO/RH: ABO/RH(D): O POS

## 2014-07-14 MED ORDER — EPHEDRINE 5 MG/ML INJ
10.0000 mg | INTRAVENOUS | Status: DC | PRN
Start: 2014-07-14 — End: 2014-07-15
  Filled 2014-07-14: qty 2

## 2014-07-14 MED ORDER — LIDOCAINE HCL (PF) 1 % IJ SOLN
30.0000 mL | INTRAMUSCULAR | Status: DC | PRN
  Filled 2014-07-14: qty 30

## 2014-07-14 MED ORDER — PHENYLEPHRINE 40 MCG/ML (10ML) SYRINGE FOR IV PUSH (FOR BLOOD PRESSURE SUPPORT)
80.0000 ug | PREFILLED_SYRINGE | INTRAVENOUS | Status: DC | PRN
  Filled 2014-07-14: qty 2

## 2014-07-14 MED ORDER — LACTATED RINGERS IV SOLN
INTRAVENOUS | Status: DC
  Administered 2014-07-14 (×3): via INTRAVENOUS

## 2014-07-14 MED ORDER — OXYCODONE-ACETAMINOPHEN 5-325 MG PO TABS: 2.0000 | ORAL_TABLET | ORAL | Status: DC | PRN

## 2014-07-14 MED ORDER — LIDOCAINE HCL (PF) 1 % IJ SOLN
INTRAMUSCULAR | Status: DC | PRN
  Administered 2014-07-14 (×2): 8 mL

## 2014-07-14 MED ORDER — CITRIC ACID-SODIUM CITRATE 334-500 MG/5ML PO SOLN: 30.0000 mL | ORAL | Status: DC | PRN

## 2014-07-14 MED ORDER — OXYTOCIN BOLUS FROM INFUSION
500.0000 mL | INTRAVENOUS | Status: DC
  Administered 2014-07-14: 500 mL via INTRAVENOUS

## 2014-07-14 MED ORDER — OXYTOCIN 40 UNITS IN LACTATED RINGERS INFUSION - SIMPLE MED: 62.5000 mL/h | INTRAVENOUS | Status: DC

## 2014-07-14 MED ORDER — LACTATED RINGERS IV SOLN: 500.0000 mL | Freq: Once | INTRAVENOUS | Status: DC

## 2014-07-14 MED ORDER — ACETAMINOPHEN 325 MG PO TABS
650.0000 mg | ORAL_TABLET | ORAL | Status: DC | PRN
Start: 2014-07-14 — End: 2014-07-15

## 2014-07-14 MED ORDER — OXYTOCIN 40 UNITS IN LACTATED RINGERS INFUSION - SIMPLE MED
1.0000 m[IU]/min | INTRAVENOUS | Status: DC
  Administered 2014-07-14: 2 m[IU]/min via INTRAVENOUS
  Filled 2014-07-14: qty 1000

## 2014-07-14 MED ORDER — EPHEDRINE 5 MG/ML INJ
10.0000 mg | INTRAVENOUS | Status: DC | PRN
  Filled 2014-07-14: qty 2

## 2014-07-14 MED ORDER — FENTANYL 2.5 MCG/ML BUPIVACAINE 1/10 % EPIDURAL INFUSION (WH - ANES)
14.0000 mL/h | INTRAMUSCULAR | Status: DC | PRN
  Administered 2014-07-14 (×2): 14 mL/h via EPIDURAL
  Filled 2014-07-14 (×2): qty 125

## 2014-07-14 MED ORDER — TERBUTALINE SULFATE 1 MG/ML IJ SOLN: 0.2500 mg | Freq: Once | INTRAMUSCULAR | Status: AC | PRN

## 2014-07-14 MED ORDER — DIPHENHYDRAMINE HCL 50 MG/ML IJ SOLN: 12.5000 mg | INTRAMUSCULAR | Status: DC | PRN

## 2014-07-14 MED ORDER — LABETALOL HCL 200 MG PO TABS
300.0000 mg | ORAL_TABLET | Freq: Two times a day (BID) | ORAL | Status: DC
  Administered 2014-07-14 (×2): 300 mg via ORAL
  Filled 2014-07-14 (×3): qty 1

## 2014-07-14 MED ORDER — PHENYLEPHRINE 40 MCG/ML (10ML) SYRINGE FOR IV PUSH (FOR BLOOD PRESSURE SUPPORT)
80.0000 ug | PREFILLED_SYRINGE | INTRAVENOUS | Status: DC | PRN
  Filled 2014-07-14: qty 2
  Filled 2014-07-14: qty 20

## 2014-07-14 MED ORDER — LACTATED RINGERS IV SOLN: 500.0000 mL | INTRAVENOUS | Status: DC | PRN

## 2014-07-14 MED ORDER — ONDANSETRON HCL 4 MG/2ML IJ SOLN: 4.0000 mg | Freq: Four times a day (QID) | INTRAMUSCULAR | Status: DC | PRN

## 2014-07-14 MED ORDER — OXYCODONE-ACETAMINOPHEN 5-325 MG PO TABS: 1.0000 | ORAL_TABLET | ORAL | Status: DC | PRN

## 2014-07-14 NOTE — Progress Notes (Addendum)
Patient ID: Sherry Garrett, female   DOB: [birth year], 39 y.o.   MRN: [id number] Doing well  Filed Vitals:   07/14/14 1106 07/14/14 1201 07/14/14 1232 07/14/14 1301  BP: 143/87 145/84 137/84 131/80  Pulse: 77 90 86 85  Temp:      TempSrc:      Height:      Weight:       FHR reactive  UCs mild every 3-5 min  Dilation: 2 Effacement (%): 80 Station: -2 Presentation: Vertex Exam by:: Mayford KnifeWilliams, CNM  Results for orders placed or performed during the hospital encounter of 07/14/14 (from the past 24 hour(s))  OB RESULT CONSOLE Group B Strep     Status: None   Collection Time: 07/14/14 12:00 AM  Result Value Ref Range   GBS Negative   Comprehensive metabolic panel     Status: Abnormal   Collection Time: 07/14/14  4:59 AM  Result Value Ref Range   Sodium 133 (L) 135 - 145 mmol/L   Potassium 4.0 3.5 - 5.1 mmol/L   Chloride 105 96 - 112 mmol/L   CO2 21 19 - 32 mmol/L   Glucose, Bld 104 (H) 70 - 99 mg/dL   BUN 8 6 - 23 mg/dL   Creatinine, Ser 9.520.48 (L) 0.50 - 1.10 mg/dL   Calcium 8.6 8.4 - 84.110.5 mg/dL   Total Protein 5.7 (L) 6.0 - 8.3 g/dL   Albumin 2.7 (L) 3.5 - 5.2 g/dL   AST 22 0 - 37 U/L   ALT 20 0 - 35 U/L   Alkaline Phosphatase 169 (H) 39 - 117 U/L   Total Bilirubin 0.1 (L) 0.3 - 1.2 mg/dL   GFR calc non Af Amer >90 >90 mL/min   GFR calc Af Amer >90 >90 mL/min   Anion gap 7 5 - 15  CBC     Status: Abnormal   Collection Time: 07/14/14  7:59 AM  Result Value Ref Range   WBC 15.1 (H) 4.0 - 10.5 K/uL   RBC 3.89 3.87 - 5.11 MIL/uL   Hemoglobin 12.2 12.0 - 15.0 g/dL   HCT 32.434.3 (L) 40.136.0 - 02.746.0 %   MCV 88.2 78.0 - 100.0 fL   MCH 31.4 26.0 - 34.0 pg   MCHC 35.6 30.0 - 36.0 g/dL   RDW 25.313.3 66.411.5 - 40.315.5 %   Platelets 204 150 - 400 K/uL  RPR     Status: None   Collection Time: 07/14/14  7:59 AM  Result Value Ref Range   RPR Ser Ql Non Reactive Non Reactive  Type and screen     Status: None   Collection Time: 07/14/14 10:10 AM  Result Value Ref Range   ABO/RH(D) O POS    Antibody Screen NEG    Sample Expiration 07/17/2014   ABO/Rh     Status: None   Collection Time: 07/14/14 10:10 AM  Result Value Ref Range   ABO/RH(D) O POS     Continue plan of care

## 2014-07-14 NOTE — Anesthesia Preprocedure Evaluation (Addendum)
Anesthesia Evaluation  Patient identified by MRN, date of birth, ID band Patient awake    Reviewed: Allergy & Precautions, H&P , Patient's Chart, lab work & pertinent test results  Airway Mallampati: II  TM Distance: >3 FB Neck ROM: full    Dental no notable dental hx.    Pulmonary Current Smoker,    Pulmonary exam normal       Cardiovascular hypertension,     Neuro/Psych negative neurological ROS  negative psych ROS   GI/Hepatic negative GI ROS, Neg liver ROS,   Endo/Other  negative endocrine ROS  Renal/GU negative Renal ROS     Musculoskeletal   Abdominal Normal abdominal exam  (+)   Peds  Hematology negative hematology ROS (+)   Anesthesia Other Findings   Reproductive/Obstetrics (+) Pregnancy                            Anesthesia Physical Anesthesia Plan  ASA: II  Anesthesia Plan: Epidural   Post-op Pain Management:    Induction:   Airway Management Planned:   Additional Equipment:   Intra-op Plan:   Post-operative Plan:   Informed Consent: I have reviewed the patients History and Physical, chart, labs and discussed the procedure including the risks, benefits and alternatives for the proposed anesthesia with the patient or authorized representative who has indicated his/her understanding and acceptance.     Plan Discussed with:   Anesthesia Plan Comments:        Anesthesia Quick Evaluation

## 2014-07-14 NOTE — Progress Notes (Signed)
Aram Beechammanda H Efaw is a 39 y.o. [phone] at 1655w0d   Subjective: Comfortable with epidural; no rectal pressure but feeling abd tightening w/ ctx  Objective: BP 141/84 mmHg  Pulse 90  Temp(Src) 98.5 F (36.9 C) (Oral)  Resp 18  Ht 5\' 2"  (1.575 m)  Wt 81.194 kg (179 lb)  BMI 32.73 kg/m2  SpO2 100%  LMP 10/28/2013   Total I/O In: -  Out: 600 [Urine:600]  FHT:  FHR: 130-140 bpm, variability: moderate,  accelerations:  Present,  decelerations:  Absent UC:   regular, every 3-5 minutes w/ Pit @ 8420mu/min SVE:   Dilation: 7 Effacement (%): 100 Station: 0 Exam by:: Selena BattenKim Diannie Willner- AROM for clear/pink-tinged fluid  Labs: Lab Results  Component Value Date   WBC 15.1* 07/14/2014   HGB 12.2 07/14/2014   HCT 34.3* 07/14/2014   MCV 88.2 07/14/2014   PLT 204 07/14/2014    Assessment / Plan: IUP@term  cHTN w/ superimposed pre-E Active labor  Eval cx in 2 hrs or sooner w/ pressure Anticipate SVD  Damika Harmon CNM 07/14/2014, 9:43 PM

## 2014-07-14 NOTE — Anesthesia Procedure Notes (Signed)
Epidural Patient location during procedure: OB Start time: 07/14/2014 1:50 PM End time: 07/14/2014 1:54 PM  Staffing Anesthesiologist: Leilani AbleHATCHETT, Rayvn Rickerson Performed by: anesthesiologist   Preanesthetic Checklist Completed: patient identified, surgical consent, pre-op evaluation, timeout performed, IV checked, risks and benefits discussed and monitors and equipment checked  Epidural Patient position: sitting Prep: site prepped and draped and DuraPrep Patient monitoring: continuous pulse ox and blood pressure Approach: midline Location: L3-L4 Injection technique: LOR air  Needle:  Needle type: Tuohy  Needle gauge: 17 G Needle length: 9 cm and 9 Needle insertion depth: 5 cm cm Catheter type: closed end flexible Catheter size: 19 Gauge Catheter at skin depth: 10 cm Test dose: negative and Other  Assessment Sensory level: T9 Events: blood not aspirated, injection not painful, no injection resistance, negative IV test and no paresthesia  Additional Notes Reason for block:procedure for pain

## 2014-07-14 NOTE — H&P (Signed)
Sherry Garrett is a 39 y.o. female presenting for Induction of Labor for chronic hypertension with superimposed. preeclampsia. Marland Kitchen.[facility] and recently developed preeclampsia. Now that she is 4657w0d we are inducing labor  Maternal Medical History:  Reason for admission: Nausea.  Contractions: Frequency: rare.   Perceived severity is mild.    Fetal activity: Perceived fetal activity is normal.   Last perceived fetal movement was within the past hour.    Prenatal complications: PIH and pre-eclampsia.   No bleeding or oligohydramnios.   Prenatal Complications - Diabetes: none.    OB History    Gravida Para Term Preterm AB TAB SAB Ectopic Multiple Living   4 2 2  1  1   2      Past Medical History  Diagnosis Date  . Genital warts   . HPV (human papilloma virus) infection   . Hematuria   . Pre-eclampsia    Past Surgical History  Procedure Laterality Date  . Appendectomy    . Laser ablation of the cervix  2005   Family History: family history includes Cancer in her maternal grandfather, other, and paternal grandfather; Diabetes in her brother, father, and paternal grandmother; Heart attack in her father and paternal grandmother; Hypertension in her father and other; Stroke in her maternal grandmother. Social History:  reports that she has been smoking Cigarettes.  She has a 4.25 pack-year smoking history. She has never used smokeless tobacco. She reports that she does not drink alcohol or use illicit drugs.  Review of Systems  Constitutional: Negative for fever, chills and malaise/fatigue.  Eyes: Negative for blurred vision and double vision.  Gastrointestinal: Negative for nausea, vomiting, abdominal pain, diarrhea and constipation.  Genitourinary: Negative for dysuria.  Neurological: Negative for dizziness and headaches.    Dilation: 2 Effacement (%): 80 Station: -2 Exam by:: Mayford KnifeWilliams, CNM Blood pressure 135/83, pulse 89,  temperature 98 F (36.7 C), temperature source Oral, height 5\' 2"  (1.575 m), weight 179 lb (81.194 kg), last menstrual period 10/28/2013. Maternal Exam:  Uterine Assessment: Contraction strength is mild.  Contraction frequency is rare.   Abdomen: Patient reports no abdominal tenderness. Fundal height is 37.   Estimated fetal weight is 7.   Fetal presentation: vertex  Introitus: Normal vulva. Normal vagina.  Vagina is negative for discharge.  Ferning test: not done.  Nitrazine test: not done. Amniotic fluid character: not assessed.  Pelvis: adequate for delivery.   Cervix: Cervix evaluated by digital exam.     Fetal Exam Fetal Monitor Review: Mode: ultrasound.   Baseline rate: 135.  Variability: moderate (6-25 bpm).   Pattern: accelerations present and no decelerations.    Fetal State Assessment: Category I - tracings are normal.     Physical Exam  Constitutional: She is oriented to person, place, and time. She appears well-developed and well-nourished. No distress.  HENT:  Head: Normocephalic.  Cardiovascular: Normal rate and regular rhythm.  Exam reveals no gallop and no friction rub.   No murmur heard. Respiratory: Effort normal. No respiratory distress. She has wheezes (single wheeze right upper lobe). She has no rales. She exhibits no tenderness.  GI: Soft. She exhibits no distension and no mass. There is no tenderness. There is no rebound and no guarding.  Genitourinary: Vagina normal. No vaginal discharge found.  Dilation: 2 Effacement (%): 80 Station: -2 Presentation: Vertex Exam by:: Mayford KnifeWilliams, CNM   Musculoskeletal: Normal range of motion.  Neurological: She is alert and oriented to person,  place, and time.  Skin: Skin is warm and dry.  Psychiatric: She has a normal mood and affect.    Prenatal labs: ABO, Rh: O/POS/-- (10/20 1640) Antibody: Negative (03/01 0907) Rubella: 6.08 (10/20 1640) RPR: Non Reactive (03/01 0907)  HBsAg: NEGATIVE (10/20 1640)  HIV:  NONREACTIVE (10/20 1640)  GBS: Negative (04/29 0000)   Assessment/Plan: A:  SIUP at [redacted]w[redacted]d        Chronic Hypertension with superimposed preeclampsia       GBS Negative (result listed as pending, Dr Emelda Fear went to lab, they said it was presumptively negative but were not calling it officially yet, per Dr Emelda Fear we can assume negative)  P:   Admit to [facility] suites             Routine orders             Foley balloon inserted             Pitocin per low dose protocol  [facility] 07/14/2014, 11:02 AM

## 2014-07-14 NOTE — Progress Notes (Signed)
Patient ID: Sherry Garrett, female   DOB: [birth year], 39 y.o.   MRN: [id number] Now has epidural  Fetal heart rate reactive UCs every 3 min  Dilation: 4.5 Effacement (%): 80 Station: -2 Presentation: Vertex Exam by:: J.Cox, RN  Continue to observe

## 2014-07-15 ENCOUNTER — Encounter (HOSPITAL_COMMUNITY): Payer: Self-pay

## 2014-07-15 LAB — CULTURE, BETA STREP (GROUP B ONLY): STREP GP B CULTURE: NEGATIVE

## 2014-07-15 MED ORDER — SENNOSIDES-DOCUSATE SODIUM 8.6-50 MG PO TABS: 2.0000 | ORAL_TABLET | ORAL | Status: DC

## 2014-07-15 MED ORDER — ONDANSETRON HCL 4 MG/2ML IJ SOLN: 4.0000 mg | INTRAMUSCULAR | Status: DC | PRN

## 2014-07-15 MED ORDER — OXYCODONE-ACETAMINOPHEN 5-325 MG PO TABS: 1.0000 | ORAL_TABLET | ORAL | Status: DC | PRN

## 2014-07-15 MED ORDER — OXYCODONE-ACETAMINOPHEN 5-325 MG PO TABS: 2.0000 | ORAL_TABLET | ORAL | Status: DC | PRN

## 2014-07-15 MED ORDER — DIPHENHYDRAMINE HCL 25 MG PO CAPS: 25.0000 mg | ORAL_CAPSULE | Freq: Four times a day (QID) | ORAL | Status: DC | PRN

## 2014-07-15 MED ORDER — ENALAPRIL MALEATE 5 MG PO TABS
5.0000 mg | ORAL_TABLET | Freq: Every day | ORAL | Status: DC
  Administered 2014-07-15 – 2014-07-16 (×2): 5 mg via ORAL
  Filled 2014-07-15 (×3): qty 1

## 2014-07-15 MED ORDER — ACETAMINOPHEN 325 MG PO TABS: 650.0000 mg | ORAL_TABLET | ORAL | Status: DC | PRN

## 2014-07-15 MED ORDER — PNEUMOCOCCAL VAC POLYVALENT 25 MCG/0.5ML IJ INJ
0.5000 mL | INJECTION | INTRAMUSCULAR | Status: AC
  Administered 2014-07-16: 0.5 mL via INTRAMUSCULAR
  Filled 2014-07-15: qty 0.5

## 2014-07-15 MED ORDER — ZOLPIDEM TARTRATE 5 MG PO TABS: 5.0000 mg | ORAL_TABLET | Freq: Every evening | ORAL | Status: DC | PRN

## 2014-07-15 MED ORDER — SIMETHICONE 80 MG PO CHEW: 80.0000 mg | CHEWABLE_TABLET | ORAL | Status: DC | PRN

## 2014-07-15 MED ORDER — DIBUCAINE 1 % RE OINT: 1.0000 "application " | TOPICAL_OINTMENT | RECTAL | Status: DC | PRN

## 2014-07-15 MED ORDER — LANOLIN HYDROUS EX OINT: TOPICAL_OINTMENT | CUTANEOUS | Status: DC | PRN

## 2014-07-15 MED ORDER — IBUPROFEN 600 MG PO TABS
600.0000 mg | ORAL_TABLET | Freq: Four times a day (QID) | ORAL | Status: DC
  Administered 2014-07-15 – 2014-07-16 (×6): 600 mg via ORAL
  Filled 2014-07-15 (×6): qty 1

## 2014-07-15 MED ORDER — WITCH HAZEL-GLYCERIN EX PADS
1.0000 "application " | MEDICATED_PAD | CUTANEOUS | Status: DC | PRN
  Administered 2014-07-15: 1 via TOPICAL

## 2014-07-15 MED ORDER — PRENATAL MULTIVITAMIN CH
1.0000 | ORAL_TABLET | Freq: Every day | ORAL | Status: DC
  Administered 2014-07-15 – 2014-07-16 (×2): 1 via ORAL
  Filled 2014-07-15 (×2): qty 1

## 2014-07-15 MED ORDER — TETANUS-DIPHTH-ACELL PERTUSSIS 5-2.5-18.5 LF-MCG/0.5 IM SUSP: 0.5000 mL | Freq: Once | INTRAMUSCULAR | Status: DC

## 2014-07-15 MED ORDER — BENZOCAINE-MENTHOL 20-0.5 % EX AERO
1.0000 "application " | INHALATION_SPRAY | CUTANEOUS | Status: DC | PRN
  Administered 2014-07-15: 1 via TOPICAL
  Filled 2014-07-15: qty 56

## 2014-07-15 MED ORDER — ONDANSETRON HCL 4 MG PO TABS: 4.0000 mg | ORAL_TABLET | ORAL | Status: DC | PRN

## 2014-07-15 NOTE — Anesthesia Postprocedure Evaluation (Signed)
Anesthesia Post Note  Patient: Sherry Garrett  Procedure(s) Performed: * No procedures listed *  Anesthesia type: Epidural  Patient location: Mother/Baby  Post pain: Pain level controlled  Post assessment: Post-op Vital signs reviewed  Last Vitals:  Filed Vitals:   07/15/14 0634  BP: 154/82  Pulse: 81  Temp: 36.4 C  Resp: 18    Post vital signs: Reviewed  Level of consciousness: awake  Complications: No apparent anesthesia complications

## 2014-07-15 NOTE — Lactation Note (Signed)
This note was copied from the chart of Sherry Garrett Kastens. Lactation Consultation Note; This is mother's 3rd child. She state's that she breastfed her first for 4 months and her last for one year. Both of her other children were born at term.  This infant is [redacted] weeks gestation at 4-15.4. Infant is 4914 hours old and has had 4-5 feeding attempts. Mother state's that infant becomes very sleepy after a few mins of suckling. Staff nurse sat up DEBP and assist with post pumping. Mother obtained 7.5 ml of colostrum. Mother to offer infant formula (Neosure) if ebm not available. Mother advised to offer ebm/formula after each feeding. Mother was given LPI supplemental guidelines and parent teaching guide on LPI behaviors. Mother very receptive to all teaching. Mother has an [employer] Amed[employer] pump at home. Discussed possible use of a nipple shield. Mother to page for latch assistance with next feeding.   Patient Name: Sherry Garrett Muench [id number]'s Date: 07/15/2014 Reason for consult: Initial assessment   Maternal Data    Feeding Feeding Type: Bottle Fed - Formula Nipple Type: Slow - flow Length of feed: 0 min  LATCH Score/Interventions                      Lactation Tools Discussed/Used     Consult Status Consult Status: Follow-up Date: 07/15/14 Follow-up type: In-patient    Stevan BornKendrick, Afia Messenger [facility] 07/15/2014, 2:36 PM

## 2014-07-15 NOTE — Progress Notes (Signed)
Post Partum Day #1 Subjective: no complaints, up ad lib and tolerating PO; breastfeeding going well; desires Depo for contraception; BPs during the night were mostly nl, although the morning they are in the 140-150s/mid 80s  Objective: Blood pressure 144/85, pulse 81, temperature 97.6 F (36.4 C), temperature source Oral, resp. rate 18, height 5\' 2"  (1.575 m), weight 81.194 kg (179 lb), last menstrual period 10/28/2013, SpO2 100 %, unknown if currently breastfeeding.  Physical Exam:  General: alert, cooperative and no distress Lochia: appropriate Uterine Fundus: firm DVT Evaluation: No evidence of DVT seen on physical exam.   Recent Labs  07/14/14 0759  HGB 12.2  HCT 34.3*    Assessment/Plan: Plan for discharge tomorrow  Change BP med from Labetalol 300BID to Vasotec 5mg  qd per Dr Shawnie PonsPratt   LOS: 1 day   Cam HaiSHAW, KIMBERLY CNM 07/15/2014, 9:44 AM

## 2014-07-16 MED ORDER — LISINOPRIL 10 MG PO TABS: 10.0000 mg | ORAL_TABLET | Freq: Every day | ORAL | Status: DC

## 2014-07-16 NOTE — Lactation Note (Signed)
This note was copied from the chart of Sherry Miles Costainmanda Guaman. Lactation Consultation Note: Mother paged for latch assistance. Observed that infant is very sleepy. Mother states that infant latches on for only a few sucks. Repositioned infant in cross cradle hold with good support. Infant sustained latch for only 5 mins with good tugging observed and a few swallows. Infant fell asleep. Infant latched on alternate breast by mother in cross cradle hold. Infant took only a few sucks. Discussed use of a nipple shield. Mother resistance to the idea. Mother has pumped 6 ml of colostrum and plans to give with a bottle. Advised mother to continue to post pump breast for 15-20 mins and supplement infant with ebm and formula.   Patient Name: Sherry Garrett Reason for consult: Follow-up assessment   Maternal Data    Feeding Feeding Type: Breast Milk Nipple Type: Slow - flow Length of feed: 5 min  LATCH Score/Interventions Latch: Repeated attempts needed to sustain latch, nipple held in mouth throughout feeding, stimulation needed to elicit sucking reflex. Intervention(s): Adjust position;Assist with latch  Audible Swallowing: A few with stimulation Intervention(s): Alternate breast massage  Type of Nipple: Everted at rest and after stimulation  Comfort (Breast/Nipple): Soft / non-tender     Hold (Positioning): Assistance needed to correctly position infant at breast and maintain latch. Intervention(s): Support Pillows;Position options;Skin to skin  LATCH Score: 7  Lactation Tools Discussed/Used     Consult Status Consult Status: Follow-up Date: 07/16/14 Follow-up type: In-patient    Stevan BornKendrick, Orest Dygert [facility] Garrett, 11:17 AM

## 2014-07-16 NOTE — Discharge Summary (Signed)
Obstetric Discharge Summary Reason for Admission: induction of labor Prenatal Procedures: Preeclampsia Intrapartum Procedures: spontaneous vaginal delivery Postpartum Procedures: none Complications-Operative and Postpartum: none  Delivery Note Pt became complete and pushed with one ctx to SVD at 11:11 PM a viable female was delivered via  (Presentation: Left Occiput Anterior).  APGAR: 9, 10; weight: 4+15.4.  Cord clamped and cut by FOB; hospital cord blood sample collected.  Placenta status: Intact, Spontaneous.  Cord: 3 vessels with the following complications: None.    Anesthesia: Epidural  Episiotomy: None Lacerations: None Est. Blood Loss (mL):  320  Mom to postpartum.  Baby to Couplet care / Skin to Skin.  Cam HaiSHAW, KIMBERLY CNM 07/14/2014, 11:34 PM  Hospital Course:  Active Problems:   Preeclampsia   Sherry Garrett is a 39 y.o. [phone] s/p SVD.  Patient was admitted for IOL 2/2 cHTN with superimposed preeclampsia.  She has postpartum course that was uncomplicated including no problems with ambulating, PO intake, urination, pain, or bleeding. The pt feels ready to go home and  will be discharged with outpatient follow-up.   Today: No acute events overnight.  Pt denies problems with ambulating, voiding or po intake.  She denies nausea or vomiting.  Pain is well controlled.  She has had flatus.Plan for birth control is  Depo-Provera.  Method of Feeding: breast  Physical Exam:  General: alert and cooperative Lochia: appropriate Uterine Fundus: firm DVT Evaluation: No evidence of DVT seen on physical exam.  H/H: Lab Results  Component Value Date/Time   HGB 12.2 07/14/2014 07:59 AM   HCT 34.3* 07/14/2014 07:59 AM    Discharge Diagnoses: Term Pregnancy-delivered  Discharge Information: Date: 07/16/2014 Activity: pelvic rest Diet: routine  Medications: lisinopril Breast feeding:  Yes Condition: stable Instructions: refer to handout Discharge to: home   Discharge  Instructions    Call MD for:  redness, tenderness, or signs of infection (pain, swelling, redness, odor or green/yellow discharge around incision site)    Complete by:  As directed      Call MD for:  severe uncontrolled pain    Complete by:  As directed      Call MD for:  temperature >100.4    Complete by:  As directed      Diet - low sodium heart healthy    Complete by:  As directed             Medication List    STOP taking these medications        labetalol 200 MG tablet  Commonly known as:  NORMODYNE      TAKE these medications        esomeprazole 20 MG capsule  Commonly known as:  NEXIUM  Take 20 mg by mouth daily at 12 noon.     hydrocortisone 2.5 % rectal cream  Commonly known as:  ANUSOL-HC  Place 1 application rectally 2 (two) times daily.     lisinopril 10 MG tablet  Commonly known as:  PRINIVIL  Take 1 tablet (10 mg total) by mouth daily.     PRENATAL VITAMIN PO  Take 1 tablet by mouth daily.     witch hazel-glycerin pad  Commonly known as:  TUCKS  Apply 1 application topically as needed for itching or hemorrhoids.           Follow-up Information    Follow up with [facility] In 6 weeks.   Contact information:   [address] [place] [id number] [phone]  Perry Mount ,MD OB Fellow 07/16/2014,8:00 AM

## 2014-07-16 NOTE — Discharge Instructions (Signed)

## 2014-07-17 ENCOUNTER — Ambulatory Visit: Payer: Self-pay

## 2014-07-17 NOTE — Lactation Note (Addendum)
This note was copied from the chart of Sherry Garrett. Lactation Consultation Note  Patient Name: Sherry Miles Costainmanda Yeldell ZOXWR'UToday's Date: 07/17/2014 Reason for consult: Follow-up assessment;Infant < 6lbs;Other (Comment) (early term baby )  Baby is 4559 hours old , and per mom has been latching and receiving breast milk in a bottle. Per mom the baby recently breast fed and received 20 ml in a bottle of EBM .  Baby is at 4 % weight loss , 4-12.4 oz , voids and stools adequate for age, Bili - 0  Per  Mom milk is in , denies engorgement  Sore nipple and engorgement prevention and tx reviewed. Per mom has a DEBP @ home. And feels comfortable with present LC plan, also [facility] Mother informed of post-discharge support and given phone number to the lactation department, including  services for phone call assistance; out-patient appointments; and breastfeeding support group. List of other  breastfeeding resources in the community given in the handout. Encouraged mother to call for problems or  concerns related to breastfeeding.   Maternal Data    Feeding Feeding Type:  (per mom recently BF and supplemented with EBM ) Length of feed: 8 min (per mom )  LATCH Score/Interventions ( [facility] RN assessed this Latch score )  Latch: Repeated attempts needed to sustain latch, nipple held in mouth throughout feeding, stimulation needed to elicit sucking reflex. Intervention(s): Adjust position;Assist with latch  Audible Swallowing: A few with stimulation Intervention(s): Skin to skin;Hand expression  Type of Nipple: Everted at rest and after stimulation  Comfort (Breast/Nipple): Soft / non-tender     Hold (Positioning): No assistance needed to correctly position infant at breast. Intervention(s): Breastfeeding basics reviewed (see LC note )  LATCH Score: 8  Lactation Tools Discussed/Used Pump Review: Milk Storage (per mom aware of milk storage )   Consult  Status Consult Status: Complete ([facility] ) Date: 07/17/14    Kathrin Greathouseorio, Glory Graefe Ann 07/17/2014, 10:27 AM

## 2014-07-26 LAB — US OB FOLLOW UP

## 2014-07-28 ENCOUNTER — Encounter: Payer: Self-pay | Admitting: Obstetrics & Gynecology

## 2014-07-28 ENCOUNTER — Ambulatory Visit (INDEPENDENT_AMBULATORY_CARE_PROVIDER_SITE_OTHER): Payer: Federal, State, Local not specified - PPO | Admitting: Obstetrics & Gynecology

## 2014-07-28 VITALS — BP 118/80 | HR 84 | Wt 163.0 lb

## 2014-07-28 DIAGNOSIS — O165 Unspecified maternal hypertension, complicating the puerperium: Secondary | ICD-10-CM

## 2014-07-28 DIAGNOSIS — O169 Unspecified maternal hypertension, unspecified trimester: Secondary | ICD-10-CM

## 2014-07-28 NOTE — Progress Notes (Signed)
Patient ID: Aram Beechammanda H Sterne, female   DOB: 1975/11/19, [age] y.o.   MRN: [id number]  Vaginal delivery 4/29 Home on lisinpopril 10 mg BP is excellent today  Continue on this dose and at 6 weeks will re evlautate need to stay on it  Breast feeding Bleeding tapering No pain or other issues  folow up pp visit 4 weeks     Face to face time:  10 minutes  Greater than 50% of the visit time was spent in counseling and coordination of care with the patient.  The summary and outline of the counseling and care coordination is summarized in the note above.   All questions were answered.

## 2014-08-02 LAB — US OB FOLLOW UP

## 2014-08-15 LAB — US OB FOLLOW UP

## 2014-08-24 ENCOUNTER — Encounter: Payer: Self-pay | Admitting: *Deleted

## 2014-08-24 ENCOUNTER — Ambulatory Visit (INDEPENDENT_AMBULATORY_CARE_PROVIDER_SITE_OTHER): Payer: Federal, State, Local not specified - PPO | Admitting: *Deleted

## 2014-08-24 ENCOUNTER — Encounter: Payer: Self-pay | Admitting: Advanced Practice Midwife

## 2014-08-24 ENCOUNTER — Ambulatory Visit (INDEPENDENT_AMBULATORY_CARE_PROVIDER_SITE_OTHER): Payer: Federal, State, Local not specified - PPO | Admitting: Advanced Practice Midwife

## 2014-08-24 DIAGNOSIS — Z3202 Encounter for pregnancy test, result negative: Secondary | ICD-10-CM

## 2014-08-24 DIAGNOSIS — Z3042 Encounter for surveillance of injectable contraceptive: Secondary | ICD-10-CM

## 2014-08-24 LAB — POCT URINE PREGNANCY: Preg Test, Ur: NEGATIVE

## 2014-08-24 MED ORDER — MEDROXYPROGESTERONE ACETATE 150 MG/ML IM SUSP: 150.0000 mg | INTRAMUSCULAR | Status: DC

## 2014-08-24 MED ORDER — MEDROXYPROGESTERONE ACETATE 150 MG/ML IM SUSP
150.0000 mg | Freq: Once | INTRAMUSCULAR | Status: AC
  Administered 2014-08-24: 150 mg via INTRAMUSCULAR

## 2014-08-24 NOTE — Progress Notes (Signed)
Sherry Garrett is a 39 y.o. who presents for a postpartum visit. She is 6 weeks postpartum following a spontaneous vaginal delivery. I have fully reviewed the prenatal and intrapartum course. The delivery was at 37 gestational weeks.  IOL for preeclampsia.  She had CHTN, but had not been on meds for a while.  DC'd on Lisinopril, but quit taking it. Anesthesia: epidural. Postpartum course has been uneventful. Baby's course has been uneventful. Baby is feeding by breast. Bleeding: no bleeding. Bowel function is normal. Bladder function is normal. Patient is not sexually active. Contraception method is none. Postpartum depression screening: negative.   Current outpatient prescriptions:  .  esomeprazole (NEXIUM) 20 MG capsule, Take 20 mg by mouth daily at 12 noon., Disp: , Rfl:  .  Prenatal Vit-Fe Fumarate-FA (PRENATAL VITAMIN PO), Take 1 tablet by mouth daily. , Disp: , Rfl:  .  witch hazel-glycerin (TUCKS) pad, Apply 1 application topically as needed for itching or hemorrhoids., Disp: , Rfl:   Review of Systems   Constitutional: Negative for fever and chills Eyes: Negative for visual disturbances Respiratory: Negative for shortness of breath, dyspnea Cardiovascular: Negative for chest pain or palpitations  Gastrointestinal: Negative for vomiting, diarrhea and constipation Genitourinary: Negative for dysuria and urgency Musculoskeletal: Negative for back pain, joint pain, myalgias  Neurological: Negative for dizziness and headaches   Objective:     Filed Vitals:   08/24/14 1002  BP: 124/88  Pulse: 72   General:  alert, cooperative and no distress   Breasts:  negative  Lungs: clear to auscultation bilaterally  Heart:  regular rate and rhythm  Abdomen: Soft, nontender   Vulva:  normal  Vagina: normal vagina  Cervix:  closed  Corpus: Well involuted     Rectal Exam: no hemorrhoids        Assessment:    normal postpartum exam.  Plan:    1. Contraception: Depo-Provera  injections 2. Follow up in:  For depo today

## 2014-08-24 NOTE — Progress Notes (Signed)
Pt here for Depo. Reports no problems at this time. Return in 12 weeks for next shot. JSY 

## 2014-11-21 ENCOUNTER — Ambulatory Visit: Payer: Federal, State, Local not specified - PPO

## 2014-11-22 ENCOUNTER — Ambulatory Visit (INDEPENDENT_AMBULATORY_CARE_PROVIDER_SITE_OTHER): Payer: Federal, State, Local not specified - PPO | Admitting: *Deleted

## 2014-11-22 ENCOUNTER — Encounter: Payer: Self-pay | Admitting: *Deleted

## 2014-11-22 DIAGNOSIS — Z3042 Encounter for surveillance of injectable contraceptive: Secondary | ICD-10-CM | POA: Diagnosis not present

## 2014-11-22 DIAGNOSIS — Z3202 Encounter for pregnancy test, result negative: Secondary | ICD-10-CM | POA: Diagnosis not present

## 2014-11-22 LAB — POCT URINE PREGNANCY: Preg Test, Ur: NEGATIVE

## 2014-11-22 MED ORDER — MEDROXYPROGESTERONE ACETATE 150 MG/ML IM SUSP
150.0000 mg | Freq: Once | INTRAMUSCULAR | Status: AC
  Administered 2014-11-22: 150 mg via INTRAMUSCULAR

## 2014-11-22 NOTE — Progress Notes (Signed)
Pt here for Depo. Pt reports no problems at this time. Return in 12 weeks for next shot. JSY 

## 2015-02-14 ENCOUNTER — Ambulatory Visit: Payer: Federal, State, Local not specified - PPO

## 2015-02-21 ENCOUNTER — Ambulatory Visit (INDEPENDENT_AMBULATORY_CARE_PROVIDER_SITE_OTHER): Payer: Federal, State, Local not specified - PPO | Admitting: *Deleted

## 2015-02-21 ENCOUNTER — Encounter: Payer: Self-pay | Admitting: *Deleted

## 2015-02-21 DIAGNOSIS — Z3042 Encounter for surveillance of injectable contraceptive: Secondary | ICD-10-CM

## 2015-02-21 DIAGNOSIS — Z3202 Encounter for pregnancy test, result negative: Secondary | ICD-10-CM | POA: Diagnosis not present

## 2015-02-21 LAB — POCT URINE PREGNANCY: PREG TEST UR: NEGATIVE

## 2015-02-21 MED ORDER — MEDROXYPROGESTERONE ACETATE 150 MG/ML IM SUSP
150.0000 mg | Freq: Once | INTRAMUSCULAR | Status: AC
  Administered 2015-02-21: 150 mg via INTRAMUSCULAR

## 2015-02-21 NOTE — Progress Notes (Signed)
Pt here for Depo. Reports no problems at this time. Return in 12 weeks for next shot. JSY 

## 2015-05-14 ENCOUNTER — Encounter: Payer: Self-pay | Admitting: Family Medicine

## 2015-05-16 ENCOUNTER — Encounter: Payer: Self-pay | Admitting: *Deleted

## 2015-05-16 ENCOUNTER — Ambulatory Visit (INDEPENDENT_AMBULATORY_CARE_PROVIDER_SITE_OTHER): Payer: Federal, State, Local not specified - PPO | Admitting: *Deleted

## 2015-05-16 DIAGNOSIS — Z3202 Encounter for pregnancy test, result negative: Secondary | ICD-10-CM | POA: Diagnosis not present

## 2015-05-16 DIAGNOSIS — Z3042 Encounter for surveillance of injectable contraceptive: Secondary | ICD-10-CM

## 2015-05-16 LAB — POCT URINE PREGNANCY: Preg Test, Ur: NEGATIVE

## 2015-05-16 MED ORDER — MEDROXYPROGESTERONE ACETATE 150 MG/ML IM SUSP
150.0000 mg | Freq: Once | INTRAMUSCULAR | Status: AC
  Administered 2015-05-16: 150 mg via INTRAMUSCULAR

## 2015-05-16 NOTE — Progress Notes (Signed)
Pt here for Depo. Pt tolerated shot well. Return in 12 weeks for next shot. JSY 

## 2015-08-08 ENCOUNTER — Other Ambulatory Visit: Payer: Self-pay | Admitting: Advanced Practice Midwife

## 2015-08-08 ENCOUNTER — Ambulatory Visit (INDEPENDENT_AMBULATORY_CARE_PROVIDER_SITE_OTHER): Payer: Federal, State, Local not specified - PPO | Admitting: *Deleted

## 2015-08-08 ENCOUNTER — Encounter: Payer: Self-pay | Admitting: *Deleted

## 2015-08-08 DIAGNOSIS — Z3042 Encounter for surveillance of injectable contraceptive: Secondary | ICD-10-CM | POA: Diagnosis not present

## 2015-08-08 DIAGNOSIS — Z3202 Encounter for pregnancy test, result negative: Secondary | ICD-10-CM | POA: Diagnosis not present

## 2015-08-08 LAB — POCT URINE PREGNANCY: Preg Test, Ur: NEGATIVE

## 2015-08-08 MED ORDER — MEDROXYPROGESTERONE ACETATE 150 MG/ML IM SUSP
150.0000 mg | Freq: Once | INTRAMUSCULAR | Status: AC
  Administered 2015-08-08: 150 mg via INTRAMUSCULAR

## 2015-08-08 NOTE — Progress Notes (Signed)
Patient ID: Sherry Garrett, female   DOB: 23-Jul-1975, 40 y.o.   MRN: [id number] Pt given Depo Provera 150 MG IM right deltoid without any complications, negative pregnancy test. Pt to return in 12 weeks for next injection

## 2015-08-24 DIAGNOSIS — K1120 Sialoadenitis, unspecified: Secondary | ICD-10-CM | POA: Diagnosis not present

## 2015-09-05 DIAGNOSIS — K080 Exfoliation of teeth due to systemic causes: Secondary | ICD-10-CM | POA: Diagnosis not present

## 2015-10-31 ENCOUNTER — Encounter: Payer: Self-pay | Admitting: *Deleted

## 2015-10-31 ENCOUNTER — Ambulatory Visit (INDEPENDENT_AMBULATORY_CARE_PROVIDER_SITE_OTHER): Payer: Federal, State, Local not specified - PPO | Admitting: *Deleted

## 2015-10-31 DIAGNOSIS — Z3042 Encounter for surveillance of injectable contraceptive: Secondary | ICD-10-CM

## 2015-10-31 DIAGNOSIS — Z3202 Encounter for pregnancy test, result negative: Secondary | ICD-10-CM

## 2015-10-31 LAB — POCT URINE PREGNANCY: Preg Test, Ur: NEGATIVE

## 2015-10-31 MED ORDER — MEDROXYPROGESTERONE ACETATE 150 MG/ML IM SUSP
150.0000 mg | Freq: Once | INTRAMUSCULAR | Status: AC
  Administered 2015-10-31: 150 mg via INTRAMUSCULAR

## 2015-10-31 NOTE — Progress Notes (Signed)
Pt here for Depo. Pt tolerated shot well. Return in 12 weeks for next shot. JSY 

## 2016-01-31 ENCOUNTER — Encounter: Payer: Self-pay | Admitting: *Deleted

## 2016-01-31 ENCOUNTER — Ambulatory Visit (INDEPENDENT_AMBULATORY_CARE_PROVIDER_SITE_OTHER): Payer: Federal, State, Local not specified - PPO | Admitting: *Deleted

## 2016-01-31 DIAGNOSIS — Z3202 Encounter for pregnancy test, result negative: Secondary | ICD-10-CM | POA: Diagnosis not present

## 2016-01-31 DIAGNOSIS — Z308 Encounter for other contraceptive management: Secondary | ICD-10-CM | POA: Diagnosis not present

## 2016-01-31 LAB — POCT URINE PREGNANCY: PREG TEST UR: NEGATIVE

## 2016-01-31 MED ORDER — MEDROXYPROGESTERONE ACETATE 150 MG/ML IM SUSP
150.0000 mg | Freq: Once | INTRAMUSCULAR | Status: AC
  Administered 2016-01-31: 150 mg via INTRAMUSCULAR

## 2016-01-31 NOTE — Progress Notes (Signed)
Pt here for Depo. Pt is 1 day late getting shot. Ok to give shot today. Pt tolerated shot well. Return in 12 weeks for next shot. JSY

## 2016-04-08 DIAGNOSIS — K080 Exfoliation of teeth due to systemic causes: Secondary | ICD-10-CM | POA: Diagnosis not present

## 2016-04-14 DIAGNOSIS — K080 Exfoliation of teeth due to systemic causes: Secondary | ICD-10-CM | POA: Diagnosis not present

## 2016-05-05 ENCOUNTER — Ambulatory Visit: Payer: Federal, State, Local not specified - PPO | Admitting: *Deleted

## 2016-05-05 DIAGNOSIS — Z3042 Encounter for surveillance of injectable contraceptive: Secondary | ICD-10-CM

## 2016-05-05 NOTE — Progress Notes (Signed)
Pt here for depo shot. Depo shot was due by 2/15. Pt stated she had sex yesterday. Informed pt per providers advice that she could not get the shot today. She needed to return in 10-14 days with no sex prior to. Pt verbalized understanding.

## 2016-05-19 ENCOUNTER — Ambulatory Visit (INDEPENDENT_AMBULATORY_CARE_PROVIDER_SITE_OTHER): Payer: Federal, State, Local not specified - PPO | Admitting: *Deleted

## 2016-05-19 ENCOUNTER — Encounter: Payer: Self-pay | Admitting: *Deleted

## 2016-05-19 DIAGNOSIS — Z3202 Encounter for pregnancy test, result negative: Secondary | ICD-10-CM | POA: Diagnosis not present

## 2016-05-19 DIAGNOSIS — Z3042 Encounter for surveillance of injectable contraceptive: Secondary | ICD-10-CM

## 2016-05-19 LAB — POCT URINE PREGNANCY: PREG TEST UR: NEGATIVE

## 2016-05-19 MED ORDER — MEDROXYPROGESTERONE ACETATE 150 MG/ML IM SUSP
150.0000 mg | Freq: Once | INTRAMUSCULAR | Status: AC
  Administered 2016-05-19: 150 mg via INTRAMUSCULAR

## 2016-05-19 NOTE — Progress Notes (Signed)
Depo Provera 150mg  given IM left deltoid without complications. Instructed pt to return in 12 weeks for next depo injection. Pt verbalized understanding.

## 2016-06-06 ENCOUNTER — Encounter: Payer: Self-pay | Admitting: Family Medicine

## 2016-06-06 ENCOUNTER — Ambulatory Visit (INDEPENDENT_AMBULATORY_CARE_PROVIDER_SITE_OTHER): Payer: Federal, State, Local not specified - PPO | Admitting: Family Medicine

## 2016-06-06 VITALS — Temp 98.7°F | Ht 62.0 in | Wt 146.0 lb

## 2016-06-06 DIAGNOSIS — J310 Chronic rhinitis: Secondary | ICD-10-CM

## 2016-06-06 DIAGNOSIS — J329 Chronic sinusitis, unspecified: Secondary | ICD-10-CM | POA: Diagnosis not present

## 2016-06-06 MED ORDER — AMOXICILLIN-POT CLAVULANATE 875-125 MG PO TABS: 1.0000 | ORAL_TABLET | Freq: Two times a day (BID) | ORAL | 0 refills | Status: DC

## 2016-06-06 NOTE — Progress Notes (Signed)
   Subjective:    Patient ID: Sherry Garrett, female    DOB: Jan 14, 1976, 41 y.o.   MRN: [id number]  Sinusitis  This is a new problem. Episode onset: 3 days. Associated symptoms include headaches and sinus pressure. Treatments tried: advil, allergy meds.   Cong and dranage came on seferal days ago  advil helps some but no a lot  Did get flu shot  No fever   No achiness or stuffiness   Frontal headache right side and associated nasal discharge some radiation from the cheeks and forehead. Improves with ibuprofen. Worse with cough and leaning forward. Deep ache in [place]         Review of Systems  HENT: Positive for sinus pressure.   Neurological: Positive for headaches.       Objective:   Physical Exam Alert, mild malaise. Hydration good Vitals stable. frontal/ maxillary tenderness evident positive nasal congestion. pharynx normal neck supple  lungs clear/no crackles or wheezes. heart regular in rhythm        Assessment & Plan:  Impression rhinosinusitis likely post viral, discussed with patient. plan antibiotics prescribed. Questions answered. Symptomatic care discussed. warning signs discussed. WSL

## 2016-08-07 ENCOUNTER — Ambulatory Visit: Payer: Federal, State, Local not specified - PPO

## 2016-08-12 ENCOUNTER — Ambulatory Visit: Payer: Federal, State, Local not specified - PPO

## 2016-08-12 ENCOUNTER — Other Ambulatory Visit: Payer: Self-pay | Admitting: Advanced Practice Midwife

## 2016-08-13 ENCOUNTER — Encounter: Payer: Self-pay | Admitting: *Deleted

## 2016-08-13 ENCOUNTER — Encounter (INDEPENDENT_AMBULATORY_CARE_PROVIDER_SITE_OTHER): Payer: Self-pay

## 2016-08-13 ENCOUNTER — Ambulatory Visit (INDEPENDENT_AMBULATORY_CARE_PROVIDER_SITE_OTHER): Payer: Federal, State, Local not specified - PPO | Admitting: *Deleted

## 2016-08-13 DIAGNOSIS — Z3042 Encounter for surveillance of injectable contraceptive: Secondary | ICD-10-CM | POA: Diagnosis not present

## 2016-08-13 DIAGNOSIS — Z3202 Encounter for pregnancy test, result negative: Secondary | ICD-10-CM

## 2016-08-13 LAB — POCT URINE PREGNANCY: PREG TEST UR: NEGATIVE

## 2016-08-13 MED ORDER — MEDROXYPROGESTERONE ACETATE 150 MG/ML IM SUSP
150.0000 mg | Freq: Once | INTRAMUSCULAR | Status: AC
  Administered 2016-08-13: 150 mg via INTRAMUSCULAR

## 2016-10-20 DIAGNOSIS — J069 Acute upper respiratory infection, unspecified: Secondary | ICD-10-CM | POA: Diagnosis not present

## 2016-10-22 DIAGNOSIS — K080 Exfoliation of teeth due to systemic causes: Secondary | ICD-10-CM | POA: Diagnosis not present

## 2016-10-28 ENCOUNTER — Ambulatory Visit (INDEPENDENT_AMBULATORY_CARE_PROVIDER_SITE_OTHER): Payer: Federal, State, Local not specified - PPO | Admitting: Family Medicine

## 2016-10-28 ENCOUNTER — Encounter: Payer: Self-pay | Admitting: Family Medicine

## 2016-10-28 VITALS — BP 132/84 | Temp 98.3°F | Wt 152.1 lb

## 2016-10-28 DIAGNOSIS — J4520 Mild intermittent asthma, uncomplicated: Secondary | ICD-10-CM | POA: Diagnosis not present

## 2016-10-28 DIAGNOSIS — J208 Acute bronchitis due to other specified organisms: Secondary | ICD-10-CM | POA: Diagnosis not present

## 2016-10-28 DIAGNOSIS — J209 Acute bronchitis, unspecified: Secondary | ICD-10-CM

## 2016-10-28 MED ORDER — CEFDINIR 300 MG PO CAPS: 300.0000 mg | ORAL_CAPSULE | Freq: Two times a day (BID) | ORAL | 0 refills | Status: DC

## 2016-10-28 MED ORDER — ALBUTEROL SULFATE HFA 108 (90 BASE) MCG/ACT IN AERS
2.0000 | INHALATION_SPRAY | Freq: Four times a day (QID) | RESPIRATORY_TRACT | 2 refills | Status: DC | PRN
Start: 2016-10-28 — End: 2017-01-28

## 2016-10-28 NOTE — Progress Notes (Signed)
   Subjective:    Patient ID: Sherry Garrett, female    DOB: May 15, 1975, 41 y.o.   MRN: [id number]  Cough  This is a new problem. The current episode started in the past 7 days. The cough is productive of sputum. She has tried oral steroids for the symptoms. The treatment provided mild relief.   Patient states no other concerns this visit.   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 Worse at nighttime. Unfortunately still smoking     Review of Systems  Respiratory: Positive for cough.        Objective:   Physical Exam  Alert active hydration good H&T moderate nasal congestion pharynx normal neck supple lungs bilateral wheezes heart regular in rhythm no tachypnea no inspiratory crackles      Assessment & Plan:  Impression acute bronchitis with reactive airways post viral aggravated by smoking. Smoking cessation discussed. Antibiotics prescribed. Inhaler use discussed

## 2016-11-03 DIAGNOSIS — K080 Exfoliation of teeth due to systemic causes: Secondary | ICD-10-CM | POA: Diagnosis not present

## 2016-11-05 ENCOUNTER — Ambulatory Visit (INDEPENDENT_AMBULATORY_CARE_PROVIDER_SITE_OTHER): Payer: Federal, State, Local not specified - PPO | Admitting: *Deleted

## 2016-11-05 ENCOUNTER — Encounter: Payer: Self-pay | Admitting: *Deleted

## 2016-11-05 DIAGNOSIS — Z3202 Encounter for pregnancy test, result negative: Secondary | ICD-10-CM

## 2016-11-05 DIAGNOSIS — Z3042 Encounter for surveillance of injectable contraceptive: Secondary | ICD-10-CM

## 2016-11-05 DIAGNOSIS — Z308 Encounter for other contraceptive management: Secondary | ICD-10-CM

## 2016-11-05 LAB — POCT URINE PREGNANCY: Preg Test, Ur: NEGATIVE

## 2016-11-05 MED ORDER — MEDROXYPROGESTERONE ACETATE 150 MG/ML IM SUSP
150.0000 mg | Freq: Once | INTRAMUSCULAR | Status: AC
  Administered 2016-11-05: 150 mg via INTRAMUSCULAR

## 2016-11-05 NOTE — Progress Notes (Signed)
Pt here for Depo. Pt tolerated shot well. Return in 12 weeks for next shot. JSY 

## 2017-01-28 ENCOUNTER — Encounter: Payer: Self-pay | Admitting: *Deleted

## 2017-01-28 ENCOUNTER — Ambulatory Visit (INDEPENDENT_AMBULATORY_CARE_PROVIDER_SITE_OTHER): Payer: Federal, State, Local not specified - PPO | Admitting: *Deleted

## 2017-01-28 DIAGNOSIS — Z3042 Encounter for surveillance of injectable contraceptive: Secondary | ICD-10-CM | POA: Diagnosis not present

## 2017-01-28 DIAGNOSIS — Z3202 Encounter for pregnancy test, result negative: Secondary | ICD-10-CM | POA: Diagnosis not present

## 2017-01-28 LAB — POCT URINE PREGNANCY: Preg Test, Ur: NEGATIVE

## 2017-01-28 MED ORDER — MEDROXYPROGESTERONE ACETATE 150 MG/ML IM SUSP
150.0000 mg | Freq: Once | INTRAMUSCULAR | Status: AC
  Administered 2017-01-28: 150 mg via INTRAMUSCULAR

## 2017-01-28 NOTE — Progress Notes (Signed)
Pt here for Depo. Pt tolerated shot well. Return in 12 weeks for next shot. JSY 

## 2017-01-28 NOTE — Addendum Note (Signed)
Addended by: Colen DarlingYOUNG, Jen Benedict S on: 01/28/2017 04:06 PM   Modules accepted: Level of Service

## 2017-03-17 DIAGNOSIS — I10 Essential (primary) hypertension: Secondary | ICD-10-CM

## 2017-03-17 HISTORY — DX: Essential (primary) hypertension: I10

## 2017-04-22 ENCOUNTER — Ambulatory Visit (INDEPENDENT_AMBULATORY_CARE_PROVIDER_SITE_OTHER): Payer: Federal, State, Local not specified - PPO | Admitting: *Deleted

## 2017-04-22 DIAGNOSIS — Z3042 Encounter for surveillance of injectable contraceptive: Secondary | ICD-10-CM | POA: Diagnosis not present

## 2017-04-22 DIAGNOSIS — Z3202 Encounter for pregnancy test, result negative: Secondary | ICD-10-CM | POA: Diagnosis not present

## 2017-04-22 LAB — POCT URINE PREGNANCY: Preg Test, Ur: NEGATIVE

## 2017-04-22 MED ORDER — MEDROXYPROGESTERONE ACETATE 150 MG/ML IM SUSP
150.0000 mg | Freq: Once | INTRAMUSCULAR | Status: AC
  Administered 2017-04-22: 150 mg via INTRAMUSCULAR

## 2017-04-22 NOTE — Progress Notes (Signed)
Pt in for depo shot. Patient tolerated well. Next dose in 12 weeks.

## 2017-06-17 DIAGNOSIS — K080 Exfoliation of teeth due to systemic causes: Secondary | ICD-10-CM | POA: Diagnosis not present

## 2017-07-01 ENCOUNTER — Telehealth: Payer: Self-pay | Admitting: Family Medicine

## 2017-07-01 ENCOUNTER — Other Ambulatory Visit: Payer: Self-pay | Admitting: *Deleted

## 2017-07-01 MED ORDER — OSELTAMIVIR PHOSPHATE 75 MG PO CAPS: 75.0000 mg | ORAL_CAPSULE | Freq: Two times a day (BID) | ORAL | 0 refills | Status: DC

## 2017-07-01 NOTE — Telephone Encounter (Signed)
Patient brought in son yesterday and now she thinks she has the flu.She states has body aches,headache,sorethroat. [facility]

## 2017-07-01 NOTE — Telephone Encounter (Signed)
tamiflu five d worth 75 bid

## 2017-07-01 NOTE — Telephone Encounter (Signed)
Med sent to pharm. Pt notified.  

## 2017-07-15 ENCOUNTER — Ambulatory Visit: Payer: Federal, State, Local not specified - PPO

## 2017-07-16 ENCOUNTER — Encounter: Payer: Self-pay | Admitting: *Deleted

## 2017-07-16 ENCOUNTER — Ambulatory Visit (INDEPENDENT_AMBULATORY_CARE_PROVIDER_SITE_OTHER): Payer: Federal, State, Local not specified - PPO | Admitting: *Deleted

## 2017-07-16 ENCOUNTER — Other Ambulatory Visit: Payer: Self-pay | Admitting: Advanced Practice Midwife

## 2017-07-16 DIAGNOSIS — Z3042 Encounter for surveillance of injectable contraceptive: Secondary | ICD-10-CM | POA: Diagnosis not present

## 2017-07-16 DIAGNOSIS — Z3202 Encounter for pregnancy test, result negative: Secondary | ICD-10-CM

## 2017-07-16 LAB — POCT URINE PREGNANCY: Preg Test, Ur: NEGATIVE

## 2017-07-16 MED ORDER — MEDROXYPROGESTERONE ACETATE 150 MG/ML IM SUSP
150.0000 mg | Freq: Once | INTRAMUSCULAR | Status: AC
  Administered 2017-07-16: 150 mg via INTRAMUSCULAR

## 2017-07-16 NOTE — Progress Notes (Signed)
Depo Provera 150 mg im given in left deltoid. Patient tolerated well. Next dose in12 weeks.

## 2017-08-25 ENCOUNTER — Other Ambulatory Visit: Payer: Self-pay

## 2017-08-25 ENCOUNTER — Encounter: Payer: Self-pay | Admitting: Adult Health

## 2017-08-25 ENCOUNTER — Ambulatory Visit (INDEPENDENT_AMBULATORY_CARE_PROVIDER_SITE_OTHER): Payer: Federal, State, Local not specified - PPO | Admitting: Adult Health

## 2017-08-25 ENCOUNTER — Other Ambulatory Visit: Payer: Self-pay | Admitting: Adult Health

## 2017-08-25 ENCOUNTER — Other Ambulatory Visit (HOSPITAL_COMMUNITY)
Admission: RE | Admit: 2017-08-25 | Discharge: 2017-08-25 | Disposition: A | Discharge status: Home / Self Care | Payer: Federal, State, Local not specified - PPO | Source: Ambulatory Visit | Attending: Adult Health | Admitting: Adult Health

## 2017-08-25 VITALS — BP 150/90 | HR 92 | Resp 18 | Ht 62.0 in | Wt 165.0 lb

## 2017-08-25 DIAGNOSIS — Z1211 Encounter for screening for malignant neoplasm of colon: Secondary | ICD-10-CM | POA: Diagnosis not present

## 2017-08-25 DIAGNOSIS — Z01411 Encounter for gynecological examination (general) (routine) with abnormal findings: Secondary | ICD-10-CM

## 2017-08-25 DIAGNOSIS — I10 Essential (primary) hypertension: Secondary | ICD-10-CM | POA: Diagnosis not present

## 2017-08-25 DIAGNOSIS — Z01419 Encounter for gynecological examination (general) (routine) without abnormal findings: Secondary | ICD-10-CM | POA: Diagnosis not present

## 2017-08-25 DIAGNOSIS — N6081 Other benign mammary dysplasias of right breast: Secondary | ICD-10-CM

## 2017-08-25 DIAGNOSIS — Z1212 Encounter for screening for malignant neoplasm of rectum: Secondary | ICD-10-CM | POA: Diagnosis not present

## 2017-08-25 DIAGNOSIS — Z1231 Encounter for screening mammogram for malignant neoplasm of breast: Secondary | ICD-10-CM

## 2017-08-25 LAB — HEMOCCULT GUIAC POC 1CARD (OFFICE): Fecal Occult Blood, POC: NEGATIVE

## 2017-08-25 MED ORDER — HYDROCHLOROTHIAZIDE 12.5 MG PO CAPS: 12.5000 mg | ORAL_CAPSULE | Freq: Every day | ORAL | 6 refills | Status: DC

## 2017-08-25 NOTE — Progress Notes (Signed)
Patient ID: Sherry Garrett, female   DOB: 1976/02/26, 42 y.o.   MRN: [id number] History of Present Illness:  Sherry Garrett is a 42 year old white female,married, in for a well woman gyn exam and pap. PCP is Lubertha SouthSteve Luking.  Current Medications, Allergies, Past Medical History, Past Surgical History, Family History and Social History were reviewed in [facility] electronic [facility] record.     Review of Systems:  Patient denies any headaches, hearing loss, fatigue, blurred vision, shortness of breath, chest pain, abdominal pain, problems with bowel movements, urination, or intercourse. No joint pain or mood swings. Her dad died in March had cancer in spinal fluid.   Physical Exam:BP (!) 150/90 (BP Location: Left Arm, Cuff Size: Normal)   Pulse 92   Resp 18   Ht 5\' 2"  (1.575 m)   Wt 165 lb (74.8 kg)   LMP  (LMP Unknown)   BMI 30.18 kg/m  General:  Well developed, well nourished, no acute distress Skin:  Warm and dry Neck:  Midline trachea, normal thyroid, good ROM, no lymphadenopathy Lungs; Clear to auscultation bilaterally Breast:  No dominant palpable mass, retraction, or nipple discharge,has 1 cm sebaceous cyst right breast at 8-9 o'clock, has been there about 3 years Cardiovascular: Regular rate and rhythm Abdomen:  Soft, non tender, no hepatosplenomegaly Pelvic:  External genitalia is normal in appearance, no lesions.  The vagina is normal in appearance. Urethra has no lesions or masses. The cervix is bulbous. Pap with HPV performed. Uterus is felt to be normal size, shape, and contour.  No adnexal masses or tenderness noted.Bladder is non tender, no masses felt. Rectal: Good sphincter tone, no polyps, or hemorrhoids felt.  Hemoccult negative. Extremities/musculoskeletal:  No swelling or varicosities noted, no clubbing or cyanosis Psych:  No mood changes, alert and cooperative,seems happy PHQ 2 score 0.Will try microzide for BP, and check labs, decrease salt. If wants cyst removed from  breast will get schedule at F/U.    Impression: 1. Encounter for gynecological examination with Papanicolaou smear of cervix   2. Essential hypertension   3. Screening for colorectal cancer   4. Sebaceous cyst of breast, right       Plan: Check CBC,CMP and TSH Meds ordered this encounter  Medications  . hydrochlorothiazide (MICROZIDE) 12.5 MG capsule    Sig: Take 1 capsule (12.5 mg total) by mouth daily.    Dispense:  30 capsule    Refill:  6    Order Specific Question:   Supervising Provider    Answer:   Lazaro ArmsEURE, LUTHER H [[id number]]  Get mammogram,number given F/U in 4 weeks Review DASH diet Physical in 1 year Pap in 3 if normal

## 2017-08-25 NOTE — Patient Instructions (Signed)
DASH Eating Plan DASH stands for "Dietary Approaches to Stop Hypertension." The DASH eating plan is a healthy eating plan that has been shown to reduce high blood pressure (hypertension). It may also reduce your risk for type 2 diabetes, heart disease, and stroke. The DASH eating plan may also help with weight loss. What are tips for following this plan? General guidelines  Avoid eating more than 2,300 mg (milligrams) of salt (sodium) a day. If you have hypertension, you may need to reduce your sodium intake to 1,500 mg a day.  Limit alcohol intake to no more than 1 drink a day for nonpregnant women and 2 drinks a day for men. One drink equals 12 oz of beer, 5 oz of wine, or 1 oz of hard liquor.  Work with your health care provider to maintain a healthy body weight or to lose weight. Ask what an ideal weight is for you.  Get at least 30 minutes of exercise that causes your heart to beat faster (aerobic exercise) most days of the week. Activities may include walking, swimming, or biking.  Work with your health care provider or diet and nutrition specialist (dietitian) to adjust your eating plan to your individual calorie needs. Reading food labels  Check food labels for the amount of sodium per serving. Choose foods with less than 5 percent of the Daily Value of sodium. Generally, foods with less than 300 mg of sodium per serving fit into this eating plan.  To find whole grains, look for the word "whole" as the first word in the ingredient list. Shopping  Buy products labeled as "low-sodium" or "no salt added."  Buy fresh foods. Avoid canned foods and premade or frozen meals. Cooking  Avoid adding salt when cooking. Use salt-free seasonings or herbs instead of table salt or sea salt. Check with your health care provider or pharmacist before using salt substitutes.  Do not fry foods. Cook foods using healthy methods such as baking, boiling, grilling, and broiling instead.  Cook with  heart-healthy oils, such as olive, canola, soybean, or sunflower oil. Meal planning   Eat a balanced diet that includes: ? 5 or more servings of fruits and vegetables each day. At each meal, try to fill half of your plate with fruits and vegetables. ? Up to 6-8 servings of whole grains each day. ? Less than 6 oz of lean meat, poultry, or fish each day. A 3-oz serving of meat is about the same size as a deck of cards. One egg equals 1 oz. ? 2 servings of low-fat dairy each day. ? A serving of nuts, seeds, or beans 5 times each week. ? Heart-healthy fats. Healthy fats called Omega-3 fatty acids are found in foods such as flaxseeds and coldwater fish, like sardines, salmon, and mackerel.  Limit how much you eat of the following: ? Canned or prepackaged foods. ? Food that is high in trans fat, such as fried foods. ? Food that is high in saturated fat, such as fatty meat. ? Sweets, desserts, sugary drinks, and other foods with added sugar. ? Full-fat dairy products.  Do not salt foods before eating.  Try to eat at least 2 vegetarian meals each week.  Eat more home-cooked food and less restaurant, buffet, and fast food.  When eating at a restaurant, ask that your food be prepared with less salt or no salt, if possible. What foods are recommended? The items listed may not be a complete list. Talk with your dietitian about what   dietary choices are best for you. Grains Whole-grain or whole-wheat bread. Whole-grain or whole-wheat pasta. Brown rice. Oatmeal. Quinoa. Bulgur. Whole-grain and low-sodium cereals. Pita bread. Low-fat, low-sodium crackers. Whole-wheat flour tortillas. Vegetables Fresh or frozen vegetables (raw, steamed, roasted, or grilled). Low-sodium or reduced-sodium tomato and vegetable juice. Low-sodium or reduced-sodium tomato sauce and tomato paste. Low-sodium or reduced-sodium canned vegetables. Fruits All fresh, dried, or frozen fruit. Canned fruit in natural juice (without  added sugar). Meat and other protein foods Skinless chicken or turkey. Ground chicken or turkey. Pork with fat trimmed off. Fish and seafood. Egg whites. Dried beans, peas, or lentils. Unsalted nuts, nut butters, and seeds. Unsalted canned beans. Lean cuts of beef with fat trimmed off. Low-sodium, lean deli meat. Dairy Low-fat (1%) or fat-free (skim) milk. Fat-free, low-fat, or reduced-fat cheeses. Nonfat, low-sodium ricotta or cottage cheese. Low-fat or nonfat yogurt. Low-fat, low-sodium cheese. Fats and oils Soft margarine without trans fats. Vegetable oil. Low-fat, reduced-fat, or light mayonnaise and salad dressings (reduced-sodium). Canola, safflower, olive, soybean, and sunflower oils. Avocado. Seasoning and other foods Herbs. Spices. Seasoning mixes without salt. Unsalted popcorn and pretzels. Fat-free sweets. What foods are not recommended? The items listed may not be a complete list. Talk with your dietitian about what dietary choices are best for you. Grains Baked goods made with fat, such as croissants, muffins, or some breads. Dry pasta or rice meal packs. Vegetables Creamed or fried vegetables. Vegetables in a cheese sauce. Regular canned vegetables (not low-sodium or reduced-sodium). Regular canned tomato sauce and paste (not low-sodium or reduced-sodium). Regular tomato and vegetable juice (not low-sodium or reduced-sodium). Pickles. Olives. Fruits Canned fruit in a light or heavy syrup. Fried fruit. Fruit in cream or butter sauce. Meat and other protein foods Fatty cuts of meat. Ribs. Fried meat. Bacon. Sausage. Bologna and other processed lunch meats. Salami. Fatback. Hotdogs. Bratwurst. Salted nuts and seeds. Canned beans with added salt. Canned or smoked fish. Whole eggs or egg yolks. Chicken or turkey with skin. Dairy Whole or 2% milk, cream, and half-and-half. Whole or full-fat cream cheese. Whole-fat or sweetened yogurt. Full-fat cheese. Nondairy creamers. Whipped toppings.  Processed cheese and cheese spreads. Fats and oils Butter. Stick margarine. Lard. Shortening. Ghee. Bacon fat. Tropical oils, such as coconut, palm kernel, or palm oil. Seasoning and other foods Salted popcorn and pretzels. Onion salt, garlic salt, seasoned salt, table salt, and sea salt. Worcestershire sauce. Tartar sauce. Barbecue sauce. Teriyaki sauce. Soy sauce, including reduced-sodium. Steak sauce. Canned and packaged gravies. Fish sauce. Oyster sauce. Cocktail sauce. Horseradish that you find on the shelf. Ketchup. Mustard. Meat flavorings and tenderizers. Bouillon cubes. Hot sauce and Tabasco sauce. Premade or packaged marinades. Premade or packaged taco seasonings. Relishes. Regular salad dressings. Where to find more information:  National Heart, Lung, and Blood Institute: www.nhlbi.nih.gov  American Heart Association: www.heart.org Summary  The DASH eating plan is a healthy eating plan that has been shown to reduce high blood pressure (hypertension). It may also reduce your risk for type 2 diabetes, heart disease, and stroke.  With the DASH eating plan, you should limit salt (sodium) intake to 2,300 mg a day. If you have hypertension, you may need to reduce your sodium intake to 1,500 mg a day.  When on the DASH eating plan, aim to eat more fresh fruits and vegetables, whole grains, lean proteins, low-fat dairy, and heart-healthy fats.  Work with your health care provider or diet and nutrition specialist (dietitian) to adjust your eating plan to your individual   calorie needs. This information is not intended to replace advice given to you by your health care provider. Make sure you discuss any questions you have with your health care provider. Document Released: 02/20/2011 Document Revised: 02/25/2016 Document Reviewed: 02/25/2016 Elsevier Interactive Patient Education  2018 [employer] Inc.  

## 2017-08-26 LAB — CBC
HEMATOCRIT: 44.7 % (ref 34.0–46.6)
Hemoglobin: 15.3 g/dL (ref 11.1–15.9)
MCH: 30.1 pg (ref 26.6–33.0)
MCHC: 34.2 g/dL (ref 31.5–35.7)
MCV: 88 fL (ref 79–97)
PLATELETS: 347 10*3/uL (ref 150–450)
RBC: 5.09 x10E6/uL (ref 3.77–5.28)
RDW: 12.9 % (ref 12.3–15.4)
WBC: 11.3 10*3/uL — AB (ref 3.4–10.8)

## 2017-08-26 LAB — COMPREHENSIVE METABOLIC PANEL
ALT: 10 IU/L (ref 0–32)
AST: 13 IU/L (ref 0–40)
Albumin/Globulin Ratio: 2 (ref 1.2–2.2)
Albumin: 4.9 g/dL (ref 3.5–5.5)
Alkaline Phosphatase: 71 IU/L (ref 39–117)
BUN/Creatinine Ratio: 12 (ref 9–23)
BUN: 8 mg/dL (ref 6–24)
Bilirubin Total: <0.2 mg/dL (ref 0.0–1.2)
CALCIUM: 9.4 mg/dL (ref 8.7–10.2)
CO2: 22 mmol/L (ref 20–29)
CREATININE: 0.68 mg/dL (ref 0.57–1.00)
Chloride: 101 mmol/L (ref 96–106)
GFR calc Af Amer: 125 mL/min/{1.73_m2} (ref >59)
GFR, EST NON AFRICAN AMERICAN: 108 mL/min/{1.73_m2} (ref >59)
GLOBULIN, TOTAL: 2.5 g/dL (ref 1.5–4.5)
Glucose: 76 mg/dL (ref 65–99)
Potassium: 4.1 mmol/L (ref 3.5–5.2)
Sodium: 140 mmol/L (ref 134–144)
Total Protein: 7.4 g/dL (ref 6.0–8.5)

## 2017-08-26 LAB — TSH: TSH: 1.14 u[IU]/mL (ref 0.450–4.500)

## 2017-08-27 ENCOUNTER — Telehealth: Payer: Self-pay | Admitting: Adult Health

## 2017-08-27 LAB — CYTOLOGY - PAP
Diagnosis: NEGATIVE
HPV (WINDOPATH): NOT DETECTED

## 2017-08-27 NOTE — Telephone Encounter (Signed)
Pt aware of labs and that WBC just slightly elevated

## 2017-08-28 ENCOUNTER — Ambulatory Visit (HOSPITAL_COMMUNITY)
Admission: RE | Admit: 2017-08-28 | Discharge: 2017-08-28 | Disposition: A | Discharge status: Home / Self Care | Payer: Federal, State, Local not specified - PPO | Source: Ambulatory Visit | Attending: Adult Health | Admitting: Adult Health

## 2017-08-28 ENCOUNTER — Encounter (HOSPITAL_COMMUNITY): Payer: Self-pay

## 2017-08-28 DIAGNOSIS — Z1231 Encounter for screening mammogram for malignant neoplasm of breast: Secondary | ICD-10-CM | POA: Insufficient documentation

## 2017-08-31 ENCOUNTER — Other Ambulatory Visit: Payer: Self-pay | Admitting: Family Medicine

## 2017-08-31 DIAGNOSIS — R928 Other abnormal and inconclusive findings on diagnostic imaging of breast: Secondary | ICD-10-CM

## 2017-09-08 ENCOUNTER — Ambulatory Visit (HOSPITAL_COMMUNITY)
Admission: RE | Admit: 2017-09-08 | Discharge: 2017-09-08 | Disposition: A | Discharge status: Home / Self Care | Payer: Federal, State, Local not specified - PPO | Source: Ambulatory Visit | Attending: Family Medicine | Admitting: Family Medicine

## 2017-09-08 DIAGNOSIS — R928 Other abnormal and inconclusive findings on diagnostic imaging of breast: Secondary | ICD-10-CM

## 2017-09-08 DIAGNOSIS — R922 Inconclusive mammogram: Secondary | ICD-10-CM | POA: Diagnosis not present

## 2017-09-08 DIAGNOSIS — N6489 Other specified disorders of breast: Secondary | ICD-10-CM | POA: Diagnosis not present

## 2017-09-22 ENCOUNTER — Ambulatory Visit (INDEPENDENT_AMBULATORY_CARE_PROVIDER_SITE_OTHER): Payer: Federal, State, Local not specified - PPO | Admitting: Adult Health

## 2017-09-22 ENCOUNTER — Encounter: Payer: Self-pay | Admitting: Adult Health

## 2017-09-22 VITALS — BP 134/92 | HR 93 | Ht 62.0 in | Wt 165.5 lb

## 2017-09-22 DIAGNOSIS — I10 Essential (primary) hypertension: Secondary | ICD-10-CM

## 2017-09-22 MED ORDER — LISINOPRIL 10 MG PO TABS: 10.0000 mg | ORAL_TABLET | Freq: Every day | ORAL | 2 refills | Status: DC

## 2017-09-22 NOTE — Progress Notes (Signed)
  Subjective:     Patient ID: Sherry Garrett, female   DOB: 08-Jan-1976, 42 y.o.   MRN: [id number]  HPI Sherry Garrett is a 42 year old white female in for BP check and feels better.   Review of Systems Feels better since taking Microzide Spot on nose Reviewed past medical,surgical, social and family history. Reviewed medications and allergies.     Objective:   Physical Exam BP (!) 134/92 (BP Location: Right Arm, Patient Position: Sitting, Cuff Size: Normal)   Pulse 93   Ht 5\' 2"  (1.575 m)   Wt 165 lb 8 oz (75.1 kg)   LMP  (LMP Unknown)   Breastfeeding? No   BMI 30.27 kg/m   Skin warm and dry. Lungs: clear to ausculation bilaterally. Cardiovascular: regular rate and rhythm. Has red spot on left side of nose.   No swelling in feet or ankles noted.  Assessment:     1. Essential hypertension       Plan:     Continue Microzide, and will add lisinopril Meds ordered this encounter  Medications  . lisinopril (PRINIVIL,ZESTRIL) 10 MG tablet    Sig: Take 1 tablet (10 mg total) by mouth daily.    Dispense:  30 tablet    Refill:  2    Order Specific Question:   Supervising Provider    Answer:   Lazaro ArmsEURE, LUTHER H [[id number]]  Number given for Dr Margo AyeHall to see about spot on nose F/U in 4 weeks for BP check

## 2017-10-08 ENCOUNTER — Ambulatory Visit (INDEPENDENT_AMBULATORY_CARE_PROVIDER_SITE_OTHER): Payer: Federal, State, Local not specified - PPO

## 2017-10-08 DIAGNOSIS — Z3202 Encounter for pregnancy test, result negative: Secondary | ICD-10-CM

## 2017-10-08 DIAGNOSIS — Z3042 Encounter for surveillance of injectable contraceptive: Secondary | ICD-10-CM | POA: Diagnosis not present

## 2017-10-08 LAB — POCT URINE PREGNANCY: Preg Test, Ur: NEGATIVE

## 2017-10-08 MED ORDER — MEDROXYPROGESTERONE ACETATE 150 MG/ML IM SUSP
150.0000 mg | Freq: Once | INTRAMUSCULAR | Status: AC
  Administered 2017-10-08: 150 mg via INTRAMUSCULAR

## 2017-10-08 NOTE — Progress Notes (Signed)
Pt here for depo injection 150 mg IM given rt deltoid. Tolerated well. Return 12 weeks for next injection. Pad CMA 

## 2017-10-27 ENCOUNTER — Encounter: Payer: Self-pay | Admitting: Adult Health

## 2017-10-27 ENCOUNTER — Ambulatory Visit: Payer: Federal, State, Local not specified - PPO | Admitting: Adult Health

## 2017-10-27 VITALS — BP 129/84 | HR 102 | Ht 62.0 in | Wt 161.2 lb

## 2017-10-27 DIAGNOSIS — I10 Essential (primary) hypertension: Secondary | ICD-10-CM | POA: Diagnosis not present

## 2017-10-27 MED ORDER — LOSARTAN POTASSIUM-HCTZ 50-12.5 MG PO TABS: 1.0000 | ORAL_TABLET | Freq: Every day | ORAL | 3 refills | Status: DC

## 2017-10-27 NOTE — Progress Notes (Signed)
  Subjective:     Patient ID: Sherry Garrett, female   DOB: 1975-05-26, 42 y.o.   MRN: [id number]  HPI Sherry Garrett is a 42 year old white female in for BP check.Has had stress at work today.Has dry cough with lisinopril.   Review of Systems +dry cough Reviewed past medical,surgical, social and family history. Reviewed medications and allergies.     Objective:   Physical Exam BP 129/84 (BP Location: Right Arm, Patient Position: Sitting, Cuff Size: Normal)   Pulse (!) 102   Ht 5\' 2"  (1.575 m)   Wt 161 lb 3.2 oz (73.1 kg)   BMI 29.48 kg/m  Skin warm and dry. Lungs: clear to ausculation bilaterally. Cardiovascular: regular rate and rhythm. Has lost 4 lbs. BP is better, but will stop lisinopril and change to losartan, but will include Microzide in the losartan so only having to take 1 pill.    Assessment:       ICD-10-CM   1. Essential hypertension I10       Plan:    Continue to watch salt and sugar Stop lisinopril due to cough  Stop Microzide Meds ordered this encounter  Medications  . losartan-hydrochlorothiazide (HYZAAR) 50-12.5 MG tablet    Sig: Take 1 tablet by mouth daily.    Dispense:  30 tablet    Refill:  3    Order Specific Question:   Supervising Provider    Answer:   Despina HiddenEURE, LUTHER H [[id number]]  F/U in 8 weeks

## 2017-12-22 ENCOUNTER — Ambulatory Visit: Payer: Federal, State, Local not specified - PPO | Admitting: Adult Health

## 2017-12-22 ENCOUNTER — Encounter: Payer: Self-pay | Admitting: Adult Health

## 2017-12-22 VITALS — BP 136/88 | HR 95 | Ht 62.0 in | Wt 163.0 lb

## 2017-12-22 DIAGNOSIS — R059 Cough, unspecified: Secondary | ICD-10-CM | POA: Insufficient documentation

## 2017-12-22 DIAGNOSIS — R0989 Other specified symptoms and signs involving the circulatory and respiratory systems: Secondary | ICD-10-CM | POA: Diagnosis not present

## 2017-12-22 DIAGNOSIS — R05 Cough: Secondary | ICD-10-CM

## 2017-12-22 DIAGNOSIS — I10 Essential (primary) hypertension: Secondary | ICD-10-CM

## 2017-12-22 MED ORDER — CEPHALEXIN 500 MG PO CAPS: 500.0000 mg | ORAL_CAPSULE | Freq: Three times a day (TID) | ORAL | 0 refills | Status: DC

## 2017-12-22 MED ORDER — AZITHROMYCIN 250 MG PO TABS: ORAL_TABLET | ORAL | 0 refills | Status: DC

## 2017-12-22 MED ORDER — HYDROCODONE-HOMATROPINE 5-1.5 MG/5ML PO SYRP: 5.0000 mL | ORAL_SOLUTION | Freq: Four times a day (QID) | ORAL | 0 refills | Status: DC | PRN

## 2017-12-22 NOTE — Progress Notes (Signed)
  Subjective:     Patient ID: Sherry Garrett, female   DOB: [birth year], 42 y.o.   MRN: [id number]  HPI Sherry Garrett is a 42 year old white female in for BP check , but has been sick all weekend and did not take BP meds.Has had cough and greenish yellow sputum. Denies sore throat or ear pain, has been taking OTC meds.  Review of Systems +cough and congestion and then pees in her pants Denies sore throat or ear pain  Reviewed past medical,surgical, social and family history. Reviewed medications and allergies.     Objective:   Physical Exam BP 136/88 (BP Location: Right Arm, Patient Position: Sitting, Cuff Size: Normal)   Pulse 95   Ht 5\' 2"  (1.575 m)   Wt 163 lb (73.9 kg)   BMI 29.81 kg/m    Skin warm and dry. Lungs: +wheezing on expiration. Cardiovascular: regular rate and rhythm.  Assessment:     1. Essential hypertension   2. Cough   3. Chest congestion       Plan:     Continue BP meds, take every day Rx azithromycin 250 mg take 2 now and then 1 daily  Rx hycodan 120 cc, take 1 tsp every 6 hours prn cough, no refills(do not take and drive)  Push fluids Follow up in 2 weeks for BP recheck

## 2017-12-31 ENCOUNTER — Ambulatory Visit (INDEPENDENT_AMBULATORY_CARE_PROVIDER_SITE_OTHER): Payer: Federal, State, Local not specified - PPO

## 2017-12-31 VITALS — Ht 64.0 in | Wt 164.0 lb

## 2017-12-31 DIAGNOSIS — Z3042 Encounter for surveillance of injectable contraceptive: Secondary | ICD-10-CM | POA: Diagnosis not present

## 2017-12-31 DIAGNOSIS — Z3202 Encounter for pregnancy test, result negative: Secondary | ICD-10-CM

## 2017-12-31 LAB — POCT URINE PREGNANCY: Preg Test, Ur: NEGATIVE

## 2017-12-31 MED ORDER — MEDROXYPROGESTERONE ACETATE 150 MG/ML IM SUSP
150.0000 mg | Freq: Once | INTRAMUSCULAR | Status: AC
  Administered 2017-12-31: 150 mg via INTRAMUSCULAR

## 2017-12-31 NOTE — Progress Notes (Signed)
Pt here for depo injection 150 mg IM given lt deltoid. Tolerated well.Return 12 weeks for next injection. Pad CMA 

## 2018-01-05 ENCOUNTER — Ambulatory Visit: Payer: Federal, State, Local not specified - PPO | Admitting: Adult Health

## 2018-01-11 DIAGNOSIS — K080 Exfoliation of teeth due to systemic causes: Secondary | ICD-10-CM | POA: Diagnosis not present

## 2018-01-13 ENCOUNTER — Ambulatory Visit: Payer: Federal, State, Local not specified - PPO | Admitting: Adult Health

## 2018-01-13 ENCOUNTER — Encounter: Payer: Self-pay | Admitting: Adult Health

## 2018-01-13 VITALS — BP 128/85 | HR 98 | Ht 62.0 in | Wt 161.3 lb

## 2018-01-13 DIAGNOSIS — I10 Essential (primary) hypertension: Secondary | ICD-10-CM | POA: Diagnosis not present

## 2018-01-13 MED ORDER — LOSARTAN POTASSIUM-HCTZ 50-12.5 MG PO TABS: 1.0000 | ORAL_TABLET | Freq: Every day | ORAL | 3 refills | Status: DC

## 2018-01-13 NOTE — Progress Notes (Signed)
  Subjective:     Patient ID: Sherry Garrett, female   DOB: 01-03-1976, 42 y.o.   MRN: [id number]  HPI Sherry Garrett is a 42 year old white female in for BP check, and feels good.  Review of Systems Feels good Reviewed past medical,surgical, social and family history. Reviewed medications and allergies.     Objective:   Physical Exam BP 128/85 (BP Location: Left Arm, Patient Position: Sitting, Cuff Size: Normal)   Pulse 98   Ht 5\' 2"  (1.575 m)   Wt 161 lb 4.8 oz (73.2 kg)   BMI 29.50 kg/m  Skin warm and dry. . Lungs: clear to ausculation bilaterally. Cardiovascular: regular rate and rhythm. Has lost 3 lbs.Praised over efforts. Continue BP meds, and watching salt and sugar.    Assessment:     1. Essential hypertension       Plan:     Meds ordered this encounter  Medications  . losartan-hydrochlorothiazide (HYZAAR) 50-12.5 MG tablet    Sig: Take 1 tablet by mouth daily.    Dispense:  90 tablet    Refill:  3    Order Specific Question:   Supervising Provider    Answer:   Despina Hidden, LUTHER H [[id number]]  F/U in 3 months for BP check and ROS

## 2018-02-01 ENCOUNTER — Ambulatory Visit (INDEPENDENT_AMBULATORY_CARE_PROVIDER_SITE_OTHER): Payer: Federal, State, Local not specified - PPO

## 2018-02-01 DIAGNOSIS — Z23 Encounter for immunization: Secondary | ICD-10-CM | POA: Diagnosis not present

## 2018-02-24 ENCOUNTER — Ambulatory Visit: Payer: Federal, State, Local not specified - PPO | Admitting: Family Medicine

## 2018-02-24 ENCOUNTER — Encounter: Payer: Self-pay | Admitting: Family Medicine

## 2018-02-24 VITALS — BP 144/90 | Temp 99.3°F | Ht 62.0 in | Wt 164.0 lb

## 2018-02-24 DIAGNOSIS — J019 Acute sinusitis, unspecified: Secondary | ICD-10-CM | POA: Diagnosis not present

## 2018-02-24 MED ORDER — DOXYCYCLINE HYCLATE 100 MG PO TABS: 100.0000 mg | ORAL_TABLET | Freq: Two times a day (BID) | ORAL | 0 refills | Status: DC

## 2018-02-24 MED ORDER — ALBUTEROL SULFATE HFA 108 (90 BASE) MCG/ACT IN AERS: INHALATION_SPRAY | RESPIRATORY_TRACT | 0 refills | Status: DC

## 2018-02-24 MED ORDER — PREDNISONE 20 MG PO TABS: ORAL_TABLET | ORAL | 0 refills | Status: DC

## 2018-02-24 NOTE — Progress Notes (Signed)
   Subjective:    Patient ID: Sherry Garrett, female    DOB: [birth year], 42 y.o.   MRN: [id number]  Sinusitis  This is a new problem. Episode onset: dec 6th. Associated symptoms include congestion, coughing, headaches and sneezing. Pertinent negatives include no chills, ear pain or sore throat. Treatments tried: otc cough meds.   Started with itchy watery eyes, sneezing a week ago. Started with cough 5 days ago, productive of green phlegm. Also reports h/a and congestion. No fevers that she knows of but temp here was 99.3 today which she says is high for her.  Has been taking mucinex-D and emergen-C. States has not taken BP medication yet this morning.   Current smoker, about 1/2 pack per day  Review of Systems  Constitutional: Negative for activity change, appetite change, chills and fever.  HENT: Positive for congestion, rhinorrhea and sneezing. Negative for ear pain and sore throat.   Respiratory: Positive for cough and wheezing.   Gastrointestinal: Negative for diarrhea, nausea and vomiting.  Neurological: Positive for headaches.       Objective:   Physical Exam  Constitutional: She is oriented to person, place, and time. She appears well-developed and well-nourished. No distress.  HENT:  Head: Normocephalic and atraumatic.  Right Ear: Tympanic membrane normal.  Left Ear: Tympanic membrane normal.  Nose: Sinus tenderness present.  Mouth/Throat: Uvula is midline and oropharynx is clear and moist.  Eyes: Right eye exhibits no discharge. Left eye exhibits no discharge.  Neck: Neck supple.  Cardiovascular: Normal rate, regular rhythm and normal heart sounds.  Pulmonary/Chest: Effort normal. No respiratory distress. She has wheezes. She has no rales.  Lymphadenopathy:    She has no cervical adenopathy.  Neurological: She is alert and oriented to person, place, and time.  Skin: Skin is warm and dry.  Nursing note and vitals reviewed.         Assessment & Plan:  Acute  rhinosinusitis  Discussed likely post-viral etiology. Recommend treatment with abx given smoking hx. Significant wheezing on exam, will treat with prednisone taper and albuterol inhaler prn. Warning signs discussed, f/u if symptoms worsen or fail to improve.  Dr. Lubertha SouthSteve Luking was consulted on this case and is in agreement with the above treatment plan.

## 2018-03-25 ENCOUNTER — Ambulatory Visit (INDEPENDENT_AMBULATORY_CARE_PROVIDER_SITE_OTHER): Payer: Federal, State, Local not specified - PPO

## 2018-03-25 VITALS — Ht 62.0 in | Wt 163.4 lb

## 2018-03-25 DIAGNOSIS — Z3202 Encounter for pregnancy test, result negative: Secondary | ICD-10-CM | POA: Diagnosis not present

## 2018-03-25 DIAGNOSIS — Z3042 Encounter for surveillance of injectable contraceptive: Secondary | ICD-10-CM

## 2018-03-25 LAB — POCT URINE PREGNANCY: PREG TEST UR: NEGATIVE

## 2018-03-25 MED ORDER — MEDROXYPROGESTERONE ACETATE 150 MG/ML IM SUSP
150.0000 mg | Freq: Once | INTRAMUSCULAR | Status: AC
  Administered 2018-03-25: 150 mg via INTRAMUSCULAR

## 2018-03-25 NOTE — Progress Notes (Signed)
Pt here for depo injection 150 mg IM given rt deltoid. Tolerated well. Return 12 weeks for injection. Pad CMA 

## 2018-04-15 ENCOUNTER — Encounter: Payer: Self-pay | Admitting: Adult Health

## 2018-04-15 ENCOUNTER — Ambulatory Visit: Payer: Federal, State, Local not specified - PPO | Admitting: Adult Health

## 2018-04-15 VITALS — BP 125/88 | HR 98 | Ht 62.0 in | Wt 166.0 lb

## 2018-04-15 DIAGNOSIS — I10 Essential (primary) hypertension: Secondary | ICD-10-CM | POA: Diagnosis not present

## 2018-04-15 NOTE — Progress Notes (Signed)
Patient ID: Sherry Garrett, female   DOB: 12/16/1975, 43 y.o.   MRN: [id number] History of Present Illness: Sherry Garrett is a 43 year old white female,marreid in for BP check. No complaints.  PCP is Lubertha SouthSteve Luking.   Current Medications, Allergies, Past Medical History, Past Surgical History, Family History and Social History were reviewed in [facility] electronic medical record.     Review of Systems: Feels good, no complaints    Physical Exam:BP 125/88 (BP Location: Left Arm, Patient Position: Sitting, Cuff Size: Normal)   Pulse 98   Ht 5\' 2"  (1.575 m)   Wt 166 lb (75.3 kg)   BMI 30.36 kg/m  General:  Well developed, well nourished, no acute distress Skin:  Warm and dry Lungs; Clear to auscultation bilaterally Cardiovascular: Regular rate and rhythm  Psych:  No mood changes, alert and cooperative,seems happy PHQ 2 score 0.   Impression: 1. Essential hypertension       Plan: Continue Hyzaar Return in about 5 months for physical

## 2018-06-16 ENCOUNTER — Ambulatory Visit: Payer: Federal, State, Local not specified - PPO

## 2018-06-21 ENCOUNTER — Ambulatory Visit (INDEPENDENT_AMBULATORY_CARE_PROVIDER_SITE_OTHER): Payer: Federal, State, Local not specified - PPO | Admitting: *Deleted

## 2018-06-21 ENCOUNTER — Encounter: Payer: Self-pay | Admitting: *Deleted

## 2018-06-21 ENCOUNTER — Other Ambulatory Visit: Payer: Self-pay

## 2018-06-21 DIAGNOSIS — Z3042 Encounter for surveillance of injectable contraceptive: Secondary | ICD-10-CM | POA: Diagnosis not present

## 2018-06-21 MED ORDER — MEDROXYPROGESTERONE ACETATE 150 MG/ML IM SUSP
150.0000 mg | Freq: Once | INTRAMUSCULAR | Status: AC
  Administered 2018-06-21: 150 mg via INTRAMUSCULAR

## 2018-06-21 NOTE — Progress Notes (Signed)
Depo Provera 150mg IM given in left deltoid with no complications. Pt to return in 12 weeks for next injection.  

## 2018-09-10 ENCOUNTER — Telehealth: Payer: Self-pay | Admitting: Obstetrics & Gynecology

## 2018-09-10 NOTE — Telephone Encounter (Signed)
Patient informed we are still not allowing any visitors or children to come in during appointment time unless physical assistance is needed. Asked if has had any exposure to anyone suspected or confirmed of having COVID-19 or if she was experiencing any of the following, to reschedule: fever, cough, shortness of breath, muscle pain, diarrhea, rash, vomiting, abdominal pain, red eye, weakness, bruising, bleeding, joint pain, or a severe headache.  Stated no to all.  Asked that she complete E-check-in via mychart prior to arrival.  Advised to check-in via [facility] and call our office on arrival in our office parking lot to complete registration over the phone. Advised to also use the provided hand sanitizer when entering the office and to wear a mask if she has one, if not, we will provide one. Pt verbalized understanding.     Advised pt to call when she gets in parking lot and nurse will come out and give to her

## 2018-09-13 ENCOUNTER — Other Ambulatory Visit: Payer: Self-pay | Admitting: Advanced Practice Midwife

## 2018-09-13 ENCOUNTER — Ambulatory Visit (INDEPENDENT_AMBULATORY_CARE_PROVIDER_SITE_OTHER): Payer: Federal, State, Local not specified - PPO | Admitting: *Deleted

## 2018-09-13 ENCOUNTER — Telehealth: Payer: Self-pay | Admitting: Adult Health

## 2018-09-13 ENCOUNTER — Other Ambulatory Visit: Payer: Self-pay

## 2018-09-13 DIAGNOSIS — Z3042 Encounter for surveillance of injectable contraceptive: Secondary | ICD-10-CM

## 2018-09-13 DIAGNOSIS — Z308 Encounter for other contraceptive management: Secondary | ICD-10-CM

## 2018-09-13 MED ORDER — MEDROXYPROGESTERONE ACETATE 150 MG/ML IM SUSP
150.0000 mg | Freq: Once | INTRAMUSCULAR | Status: AC
  Administered 2018-09-13: 150 mg via INTRAMUSCULAR

## 2018-09-13 NOTE — Telephone Encounter (Signed)
Called Depo to [place] with no refills. Pt needs physical scheduled so call was transferred to front desk for appt. Sherry Garrett

## 2018-09-13 NOTE — Progress Notes (Signed)
Pt here for Depo. Pt received shot in right deltoid. Pt tolerated shot well. Return for physical and in 12 weeks for Depo. Happy

## 2018-09-13 NOTE — Telephone Encounter (Signed)
Ok, that is fine

## 2018-09-13 NOTE — Telephone Encounter (Signed)
Patient called stating that called her pharmacy to refill her depo but they let her know that she does not have any refills left. They send a fax for a refill to [facility]. Please contact pharmacy

## 2018-11-02 ENCOUNTER — Other Ambulatory Visit (HOSPITAL_COMMUNITY): Payer: Self-pay | Admitting: Family Medicine

## 2018-11-02 DIAGNOSIS — Z1231 Encounter for screening mammogram for malignant neoplasm of breast: Secondary | ICD-10-CM

## 2018-11-08 ENCOUNTER — Other Ambulatory Visit: Payer: Federal, State, Local not specified - PPO | Admitting: Adult Health

## 2018-11-25 ENCOUNTER — Encounter: Payer: Self-pay | Admitting: Advanced Practice Midwife

## 2018-11-25 ENCOUNTER — Other Ambulatory Visit (HOSPITAL_COMMUNITY): Payer: Self-pay | Admitting: Adult Health

## 2018-11-25 ENCOUNTER — Ambulatory Visit (INDEPENDENT_AMBULATORY_CARE_PROVIDER_SITE_OTHER): Payer: Federal, State, Local not specified - PPO | Admitting: Advanced Practice Midwife

## 2018-11-25 ENCOUNTER — Other Ambulatory Visit: Payer: Self-pay

## 2018-11-25 VITALS — BP 145/99 | HR 92 | Ht 62.5 in | Wt 173.0 lb

## 2018-11-25 DIAGNOSIS — Z01419 Encounter for gynecological examination (general) (routine) without abnormal findings: Secondary | ICD-10-CM | POA: Diagnosis not present

## 2018-11-25 DIAGNOSIS — Z1231 Encounter for screening mammogram for malignant neoplasm of breast: Secondary | ICD-10-CM

## 2018-11-25 MED ORDER — MEDROXYPROGESTERONE ACETATE 150 MG/ML IM SUSY: 1.0000 mL | PREFILLED_SYRINGE | INTRAMUSCULAR | 3 refills | Status: DC

## 2018-11-25 NOTE — Progress Notes (Signed)
Sherry Garrett 43 y.o.  Vitals:   11/25/18 0838  BP: (!) 145/99  Pulse: 92     Filed Weights   11/25/18 0838  Weight: 173 lb (78.5 kg)    Past Medical History: Past Medical History:  Diagnosis Date  . Benign essential hypertension 2019  . Genital warts   . Hematuria   . HPV (human papilloma virus) infection   . Pre-eclampsia     Past Surgical History: Past Surgical History:  Procedure Laterality Date  . APPENDECTOMY    . LASER ABLATION OF THE CERVIX  2005    Family History: Family History  Problem Relation Age of Onset  . Diabetes Father   . Heart attack Father   . Hypertension Father   . Cancer Father 18       cancer in spinal fluid  . Cancer Paternal Grandfather   . Diabetes Paternal Grandmother   . Heart attack Paternal Grandmother   . Stroke Maternal Grandmother        mini strokes  . Cancer Maternal Grandfather        prostate  . Diabetes Brother   . Cancer Other   . Hypertension Other     Social History: Social History   Tobacco Use  . Smoking status: Current Every Day Smoker    Packs/day: 0.50    Years: 17.00    Pack years: 8.50    Types: Cigarettes  . Smokeless tobacco: Never Used  Substance Use Topics  . Alcohol use: Yes    Comment: occ  . Drug use: No    Allergies: No Known Allergies    Current Outpatient Medications:  .  losartan-hydrochlorothiazide (HYZAAR) 50-12.5 MG tablet, Take 1 tablet by mouth daily., Disp: 90 tablet, Rfl: 3 .  medroxyPROGESTERone Acetate 150 MG/ML [name], Inject 1 mL (150 mg total) into the muscle every 3 (three) months., Disp: 1 mL, Rfl: 3 .  Triamcinolone Acetonide (NASACORT ALLERGY 24HR NA), Place into the nose as needed., Disp: , Rfl:   History of Present Illness: here for physical. Last pa 2019, normal. Last mammogram June 2019,normal. Dx w/CHTN last year, on meds. Didn't take yet today. Amenorrheic w/depo, loves it.   Review of Systems   Patient denies any headaches, blurred vision, shortness of  breath, chest pain, abdominal pain, problems with bowel movements, urination, or intercourse.   Physical Exam: General:  Well developed, well nourished, no acute distress Skin:  Warm and dry Neck:  Midline trachea, normal thyroid Lungs; Clear to auscultation bilaterally Breast:  No dominant palpable mass, retraction, or nipple discharge Cardiovascular: Regular rate and rhythm Abdomen:  Soft, non tender, no hepatosplenomegaly Pelvic:  External genitalia is normal in appearance.  The vagina is normal in appearance.  The cervix is bulbous.  Uterus is felt to be normal size, shape, and contour.  No adnexal masses or tenderness noted.  Extremities:  No swelling or varicosities noted Psych:  No mood changes.     Impression: normal gyn exam     Plan: get mammogram Check BP w/next depo shot.

## 2018-12-03 ENCOUNTER — Telehealth: Payer: Self-pay | Admitting: Obstetrics & Gynecology

## 2018-12-03 NOTE — Telephone Encounter (Signed)
Called patient regarding appointment scheduled in our office encouraged to come alone to the visit if possible, however, a support person, over age 44, may accompany her  to appointment if assistance is needed for safety or care concerns. Otherwise, support persons should remain outside until the visit is complete.  ° °We ask if you have had any exposure to anyone suspected or confirmed of having COVID-19 or if you are experiencing any of the following, to call and reschedule your appointment: fever, cough, shortness of breath, muscle pain, diarrhea, rash, vomiting, abdominal pain, red eye, weakness, bruising, bleeding, joint pain, or a severe headache.  ° °Please know we will ask you these questions or similar questions when you arrive for your appointment and again it’s how we are keeping everyone safe.   ° °Also,to keep you safe, please use the provided hand sanitizer when you enter the office. We are asking everyone in the office to wear a mask to help prevent the spread of °germs. If you have a mask of your own, please wear it to your appointment, if not, we are happy to provide one for you. ° °Thank you for understanding and your cooperation.  ° ° °[facility] Staff ° ° ° °

## 2018-12-06 ENCOUNTER — Other Ambulatory Visit: Payer: Self-pay

## 2018-12-06 ENCOUNTER — Ambulatory Visit (INDEPENDENT_AMBULATORY_CARE_PROVIDER_SITE_OTHER): Payer: Federal, State, Local not specified - PPO

## 2018-12-06 VITALS — Ht 62.0 in | Wt 175.0 lb

## 2018-12-06 DIAGNOSIS — Z3042 Encounter for surveillance of injectable contraceptive: Secondary | ICD-10-CM

## 2018-12-06 MED ORDER — MEDROXYPROGESTERONE ACETATE 150 MG/ML IM SUSP
150.0000 mg | Freq: Once | INTRAMUSCULAR | Status: AC
  Administered 2018-12-06: 150 mg via INTRAMUSCULAR

## 2018-12-06 NOTE — Progress Notes (Signed)
Pt here for depo injection 150 mg IM given lt deltoid. Tolerated well.Return 12 weeks for next injection. Pad CMA 

## 2018-12-08 ENCOUNTER — Ambulatory Visit (HOSPITAL_COMMUNITY)
Admission: RE | Admit: 2018-12-08 | Discharge: 2018-12-08 | Disposition: A | Discharge status: Home / Self Care | Payer: Federal, State, Local not specified - PPO | Source: Ambulatory Visit | Attending: Adult Health | Admitting: Adult Health

## 2018-12-08 ENCOUNTER — Other Ambulatory Visit: Payer: Self-pay

## 2018-12-08 ENCOUNTER — Other Ambulatory Visit (HOSPITAL_COMMUNITY): Payer: Self-pay | Admitting: Adult Health

## 2018-12-08 DIAGNOSIS — Z1231 Encounter for screening mammogram for malignant neoplasm of breast: Secondary | ICD-10-CM | POA: Insufficient documentation

## 2018-12-08 DIAGNOSIS — R928 Other abnormal and inconclusive findings on diagnostic imaging of breast: Secondary | ICD-10-CM

## 2018-12-21 ENCOUNTER — Ambulatory Visit (HOSPITAL_COMMUNITY)
Admission: RE | Admit: 2018-12-21 | Discharge: 2018-12-21 | Disposition: A | Discharge status: Home / Self Care | Payer: Federal, State, Local not specified - PPO | Source: Ambulatory Visit | Attending: Adult Health | Admitting: Adult Health

## 2018-12-21 ENCOUNTER — Other Ambulatory Visit: Payer: Self-pay

## 2018-12-21 DIAGNOSIS — R928 Other abnormal and inconclusive findings on diagnostic imaging of breast: Secondary | ICD-10-CM

## 2018-12-21 DIAGNOSIS — N6011 Diffuse cystic mastopathy of right breast: Secondary | ICD-10-CM | POA: Diagnosis not present

## 2018-12-21 DIAGNOSIS — N6489 Other specified disorders of breast: Secondary | ICD-10-CM | POA: Diagnosis not present

## 2018-12-21 DIAGNOSIS — R922 Inconclusive mammogram: Secondary | ICD-10-CM | POA: Diagnosis not present

## 2019-02-01 IMAGING — MG DIGITAL SCREENING BILATERAL MAMMOGRAM WITH CAD
4 series · 4 of 4 positions shown · non-contrast
Comparison: None

CLINICAL DATA: Screening.

EXAM:
DIGITAL SCREENING BILATERAL MAMMOGRAM WITH CAD

[L MLO]
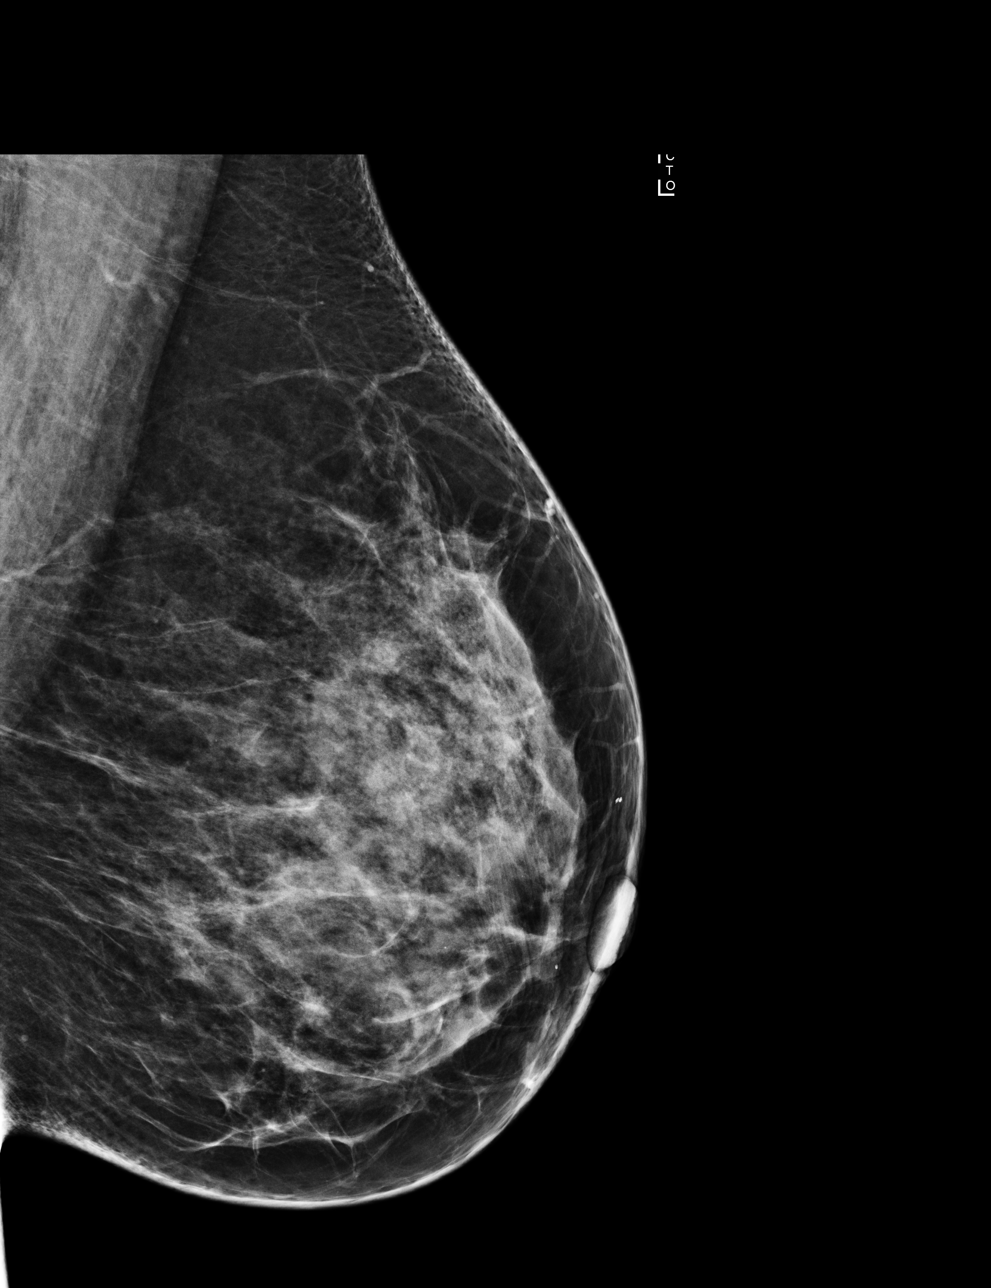

[R MLO]
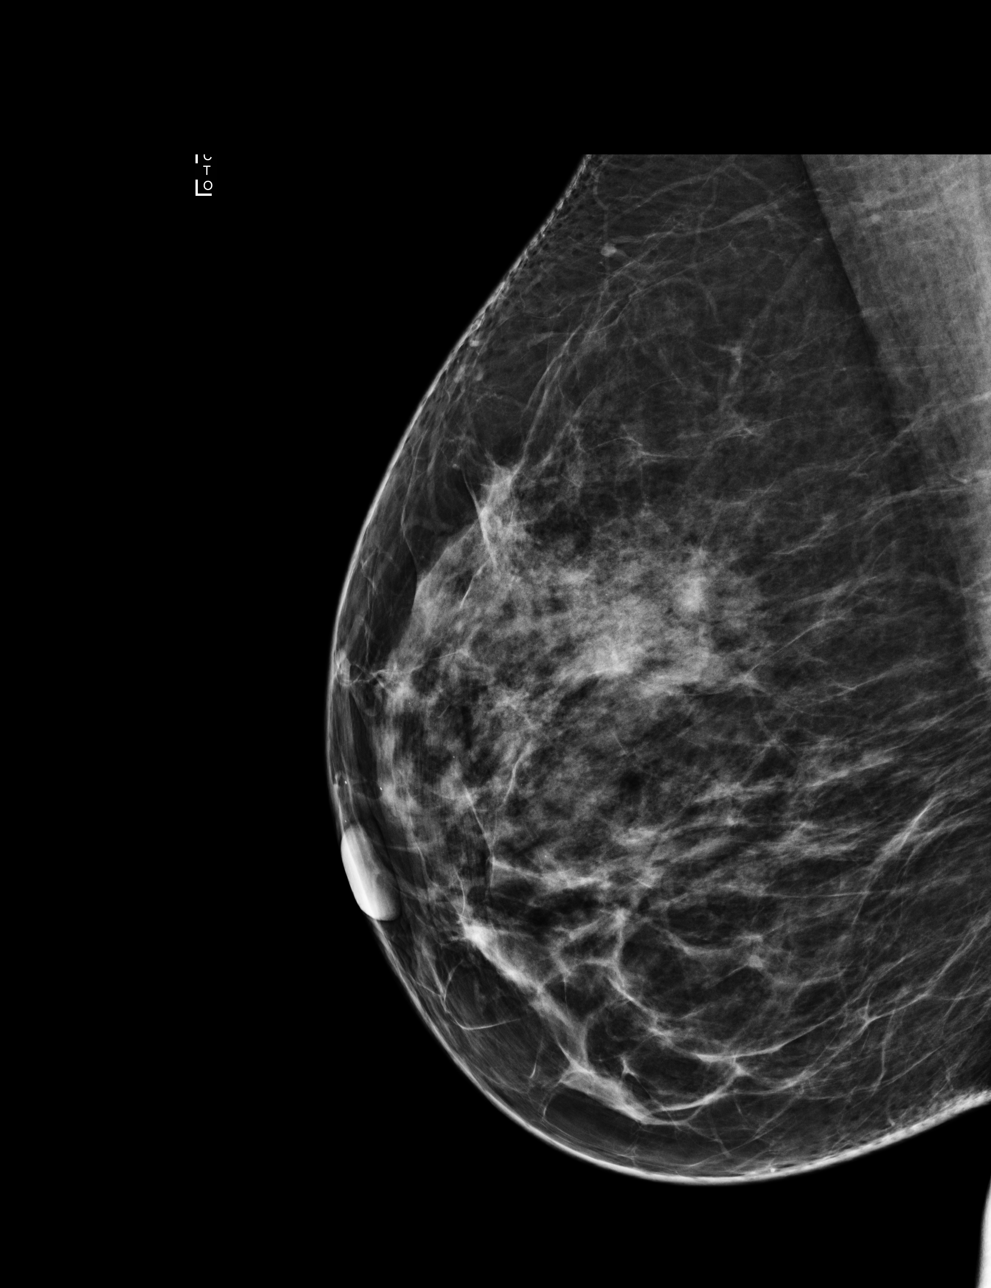

[R CC]
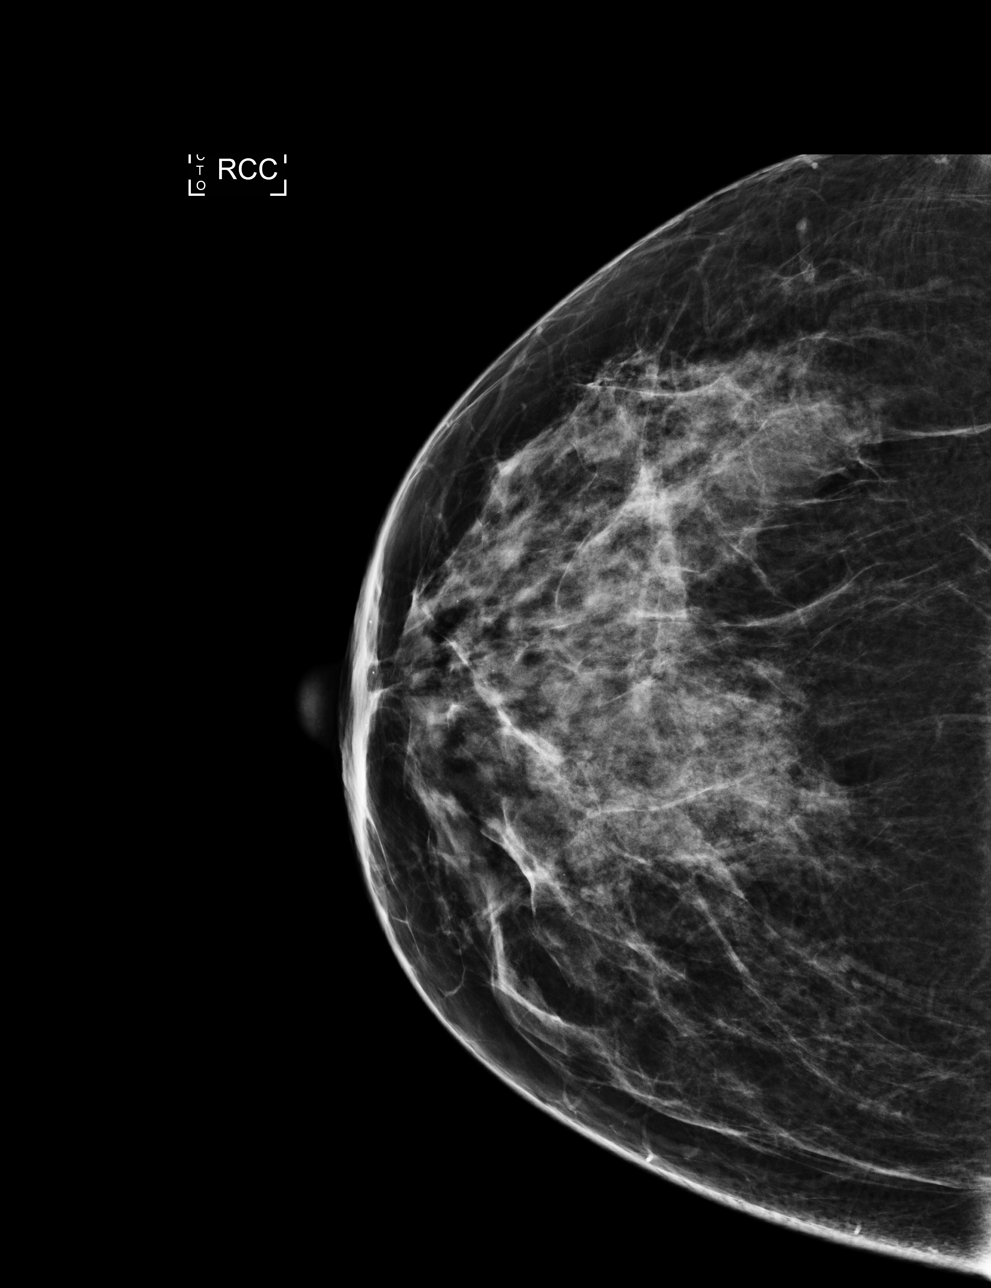

[L CC]
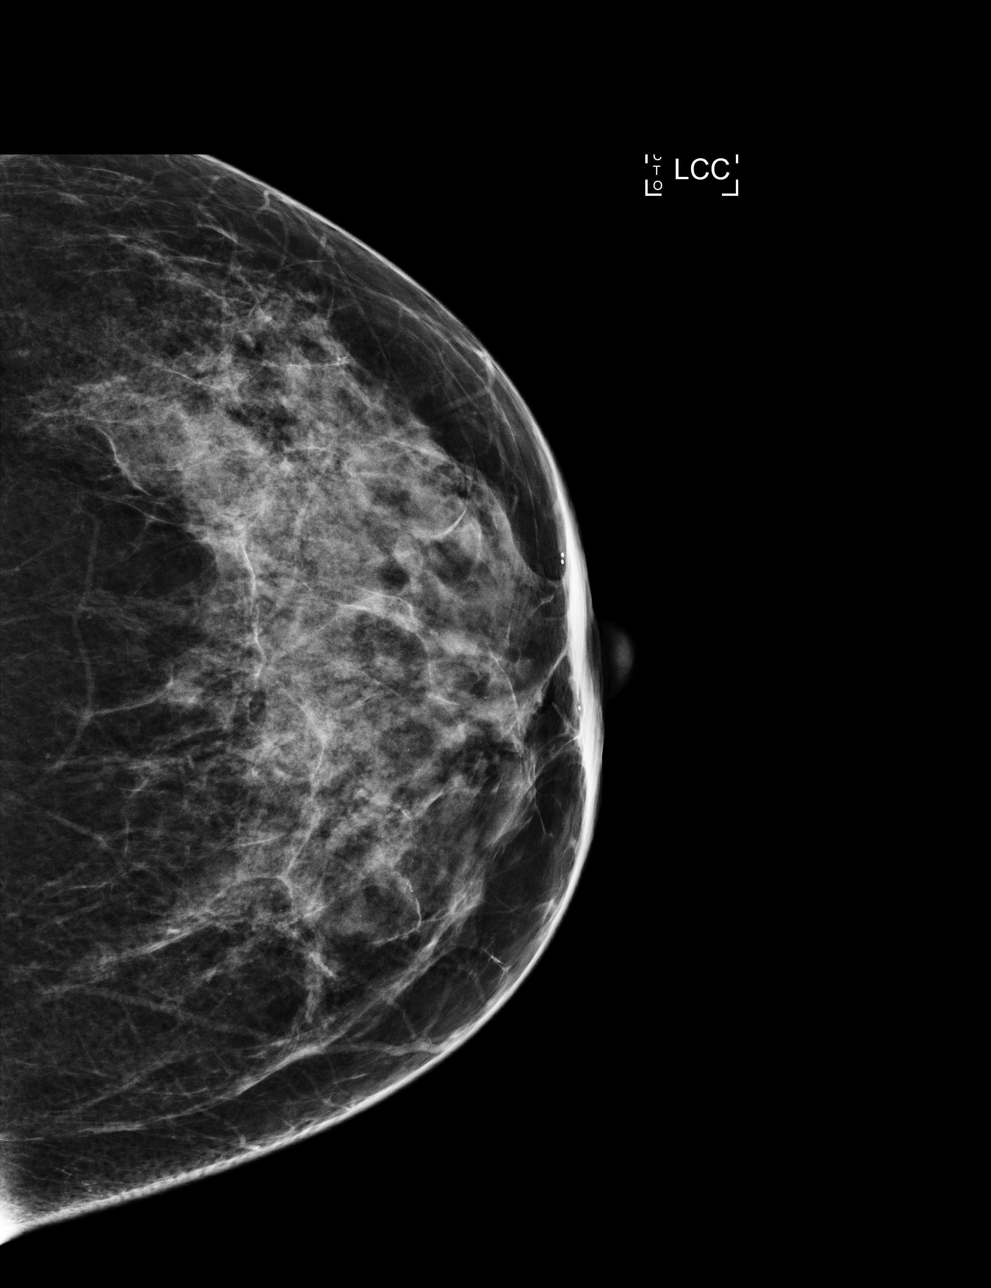

[4 of 4 positions shown; findings below may reference images not displayed]

ACR Breast Density Category c: The breast tissue is heterogeneously
dense, which may obscure small masses.
FINDINGS: In the right breast, a possible asymmetry warrants further
evaluation. In the left breast, no findings suspicious for
malignancy. Images were processed with CAD.
IMPRESSION: Further evaluation is suggested for possible asymmetry in the right
breast.

RECOMMENDATION:
Diagnostic mammogram and possibly ultrasound of the right breast.
(Code:[id number])

The patient will be contacted regarding the findings, and additional
imaging will be scheduled.

BI-RADS CATEGORY  0: Incomplete. Need additional imaging evaluation
and/or prior mammograms for comparison.

## 2019-02-25 ENCOUNTER — Telehealth: Payer: Self-pay | Admitting: Obstetrics & Gynecology

## 2019-02-25 NOTE — Telephone Encounter (Signed)

## 2019-02-28 ENCOUNTER — Ambulatory Visit: Payer: Federal, State, Local not specified - PPO

## 2019-03-02 ENCOUNTER — Telehealth: Payer: Self-pay | Admitting: Advanced Practice Midwife

## 2019-03-02 NOTE — Telephone Encounter (Signed)

## 2019-03-03 ENCOUNTER — Other Ambulatory Visit: Payer: Self-pay

## 2019-03-03 ENCOUNTER — Ambulatory Visit (INDEPENDENT_AMBULATORY_CARE_PROVIDER_SITE_OTHER): Payer: Federal, State, Local not specified - PPO | Admitting: *Deleted

## 2019-03-03 DIAGNOSIS — Z3042 Encounter for surveillance of injectable contraceptive: Secondary | ICD-10-CM | POA: Diagnosis not present

## 2019-03-03 MED ORDER — MEDROXYPROGESTERONE ACETATE 150 MG/ML IM SUSP
150.0000 mg | Freq: Once | INTRAMUSCULAR | Status: AC
  Administered 2019-03-03: 16:00:00 150 mg via INTRAMUSCULAR

## 2019-03-03 NOTE — Progress Notes (Signed)
   NURSE VISIT- INJECTION  SUBJECTIVE:  Sherry Garrett is a 43 y.o. [phone] female here for a Depo Provera for contraception/period management. She is a GYN patient.   OBJECTIVE:  There were no vitals taken for this visit.  Appears well, in no apparent distress  Injection administered in: Right deltoid  Meds ordered this encounter  Medications  . medroxyPROGESTERone (DEPO-PROVERA) injection 150 mg    ASSESSMENT: GYN patient Depo Provera for contraception/period management PLAN: Follow-up: in 11-13 weeks for next Depo   Rolena Infante  03/03/2019 3:57 PM

## 2019-03-28 ENCOUNTER — Ambulatory Visit: Payer: Federal, State, Local not specified - PPO | Attending: Internal Medicine

## 2019-03-28 ENCOUNTER — Other Ambulatory Visit: Payer: Self-pay

## 2019-03-28 DIAGNOSIS — Z20822 Contact with and (suspected) exposure to covid-19: Secondary | ICD-10-CM | POA: Diagnosis not present

## 2019-03-29 LAB — NOVEL CORONAVIRUS, NAA: SARS-CoV-2, NAA: NOT DETECTED

## 2019-04-18 ENCOUNTER — Encounter: Payer: Self-pay | Admitting: Family Medicine

## 2019-05-26 ENCOUNTER — Ambulatory Visit (INDEPENDENT_AMBULATORY_CARE_PROVIDER_SITE_OTHER): Payer: Federal, State, Local not specified - PPO

## 2019-05-26 ENCOUNTER — Other Ambulatory Visit: Payer: Self-pay

## 2019-05-26 VITALS — Ht 62.0 in | Wt 174.2 lb

## 2019-05-26 DIAGNOSIS — Z3042 Encounter for surveillance of injectable contraceptive: Secondary | ICD-10-CM | POA: Diagnosis not present

## 2019-05-26 MED ORDER — MEDROXYPROGESTERONE ACETATE 150 MG/ML IM SUSP
150.0000 mg | Freq: Once | INTRAMUSCULAR | Status: AC
  Administered 2019-05-26: 150 mg via INTRAMUSCULAR

## 2019-05-26 NOTE — Progress Notes (Signed)
   NURSE VISIT- INJECTION  SUBJECTIVE:  Sherry Garrett is a 44 y.o. [phone] female here for a depo injection:. She is a GYN patient.   OBJECTIVE:  Ht 5\' 2"  (1.575 m)   Wt 174 lb 3.2 oz (79 kg)   BMI 31.86 kg/m   Appears well, in no apparent distress  Injection administered in: Left deltoid  Meds ordered this encounter  Medications  . medroxyPROGESTERone (DEPO-PROVERA) injection 150 mg    ASSESSMENT:  Depo Provera for contraception/period management PLAN: Follow-up: in 11-13 weeks for next Depo   05-04-1971  05/26/2019 4:21 PM

## 2019-06-24 ENCOUNTER — Ambulatory Visit: Payer: Federal, State, Local not specified - PPO | Attending: Internal Medicine

## 2019-06-24 DIAGNOSIS — Z23 Encounter for immunization: Secondary | ICD-10-CM

## 2019-06-24 NOTE — Progress Notes (Signed)
   Covid-19 Vaccination Clinic  Name:  TAMMALA WEIDER    MRN: [id number] DOB: 09-16-1975  06/24/2019  Ms. Wix was observed post Covid-19 immunization for 15 minutes without incident. She was provided with Vaccine Information Sheet and instruction to access the V-Safe system.   Ms. Ellingsen was instructed to call 911 with any severe reactions post vaccine: Marland Kitchen Difficulty breathing  . Swelling of face and throat  . A fast heartbeat  . A bad rash all over body  . Dizziness and weakness   Immunizations Administered    Name Date Dose VIS Date Route   Moderna COVID-19 Vaccine 06/24/2019 12:12 PM 0.5 mL 02/15/2019 Intramuscular   Manufacturer: Gala Murdoch   Lot: [id number]   NDC: 95093-267-12      Covid-19 Vaccination Clinic  Name:  MEGHAM DWYER    MRN: [id number] DOB: 1975-10-30  06/24/2019  Ms. Babson was observed post Covid-19 immunization for 15 minutes without incident. She was provided with Vaccine Information Sheet and instruction to access the V-Safe system.   Ms. Browder was instructed to call 911 with any severe reactions post vaccine: Marland Kitchen Difficulty breathing  . Swelling of face and throat  . A fast heartbeat  . A bad rash all over body  . Dizziness and weakness   Immunizations Administered    Name Date Dose VIS Date Route   Moderna COVID-19 Vaccine 06/24/2019 12:12 PM 0.5 mL 02/15/2019 Intramuscular   Manufacturer: Moderna   Lot: [id number]   NDC: 76734-193-79

## 2019-07-26 ENCOUNTER — Ambulatory Visit: Payer: Federal, State, Local not specified - PPO

## 2019-07-26 ENCOUNTER — Ambulatory Visit: Payer: Federal, State, Local not specified - PPO | Attending: Internal Medicine

## 2019-07-26 DIAGNOSIS — Z23 Encounter for immunization: Secondary | ICD-10-CM

## 2019-07-26 NOTE — Progress Notes (Signed)
   Covid-19 Vaccination Clinic  Name:  Sherry Garrett    MRN: [id number] DOB: 1975/11/04  07/26/2019  [name] was observed post Covid-19 immunization for 15 minutes without incident. She was provided with Vaccine Information Sheet and instruction to access the V-Safe system.   [name] was instructed to call 911 with any severe reactions post vaccine: Marland Kitchen Difficulty breathing  . Swelling of face and throat  . A fast heartbeat  . A bad rash all over body  . Dizziness and weakness   Immunizations Administered    Name Date Dose VIS Date Route   Moderna COVID-19 Vaccine 07/26/2019  9:46 AM 0.5 mL 02/2019 Intramuscular   Manufacturer: Moderna   Lot: [id number]   NDC: 60888-358-44

## 2019-07-27 ENCOUNTER — Ambulatory Visit: Payer: Federal, State, Local not specified - PPO

## 2019-08-08 ENCOUNTER — Other Ambulatory Visit: Payer: Self-pay | Admitting: Adult Health

## 2019-08-11 ENCOUNTER — Ambulatory Visit (INDEPENDENT_AMBULATORY_CARE_PROVIDER_SITE_OTHER): Payer: Federal, State, Local not specified - PPO | Admitting: *Deleted

## 2019-08-11 DIAGNOSIS — Z3042 Encounter for surveillance of injectable contraceptive: Secondary | ICD-10-CM

## 2019-08-11 MED ORDER — MEDROXYPROGESTERONE ACETATE 150 MG/ML IM SUSP
150.0000 mg | Freq: Once | INTRAMUSCULAR | Status: AC
  Administered 2019-08-11: 150 mg via INTRAMUSCULAR

## 2019-08-11 NOTE — Progress Notes (Signed)
   NURSE VISIT- INJECTION  SUBJECTIVE:  Sherry Garrett is a 44 y.o. [phone] female here for a Depo Provera for contraception/period management. She is a GYN patient.   OBJECTIVE:  There were no vitals taken for this visit.  Appears well, in no apparent distress  Injection administered in: Right deltoid  Meds ordered this encounter  Medications  . medroxyPROGESTERone (DEPO-PROVERA) injection 150 mg    ASSESSMENT: GYN patient Depo Provera for contraception/period management PLAN: Follow-up: in 11-13 weeks for next Depo   Jobe Marker  08/11/2019 4:17 PM

## 2019-11-01 ENCOUNTER — Other Ambulatory Visit: Payer: Self-pay | Admitting: Advanced Practice Midwife

## 2019-11-03 ENCOUNTER — Other Ambulatory Visit: Payer: Self-pay

## 2019-11-03 ENCOUNTER — Ambulatory Visit (INDEPENDENT_AMBULATORY_CARE_PROVIDER_SITE_OTHER): Payer: Federal, State, Local not specified - PPO | Admitting: *Deleted

## 2019-11-03 DIAGNOSIS — Z308 Encounter for other contraceptive management: Secondary | ICD-10-CM | POA: Diagnosis not present

## 2019-11-03 MED ORDER — MEDROXYPROGESTERONE ACETATE 150 MG/ML IM SUSP
150.0000 mg | Freq: Once | INTRAMUSCULAR | Status: AC
  Administered 2019-11-03: 150 mg via INTRAMUSCULAR

## 2019-11-03 NOTE — Progress Notes (Signed)
° °  NURSE VISIT- INJECTION  SUBJECTIVE:  Sherry Garrett is a 44 y.o. [phone] female here for a Depo Provera for contraception/period management. She is a GYN patient.   OBJECTIVE:  There were no vitals taken for this visit.  Appears well, in no apparent distress  Injection administered in: Left deltoid  Meds ordered this encounter  Medications   medroxyPROGESTERone (DEPO-PROVERA) injection 150 mg    ASSESSMENT: GYN patient Depo Provera for contraception/period management PLAN: Follow-up: in 11-13 weeks for next Depo   Malachy Mood  11/03/2019 4:53 PM

## 2019-11-15 ENCOUNTER — Other Ambulatory Visit: Payer: Self-pay | Admitting: Adult Health

## 2019-11-16 ENCOUNTER — Encounter: Payer: Self-pay | Admitting: Family Medicine

## 2019-11-16 ENCOUNTER — Ambulatory Visit (INDEPENDENT_AMBULATORY_CARE_PROVIDER_SITE_OTHER): Payer: Federal, State, Local not specified - PPO | Admitting: Family Medicine

## 2019-11-16 ENCOUNTER — Other Ambulatory Visit: Payer: Self-pay

## 2019-11-16 VITALS — HR 98 | Temp 98.4°F | Resp 16

## 2019-11-16 DIAGNOSIS — J019 Acute sinusitis, unspecified: Secondary | ICD-10-CM

## 2019-11-16 MED ORDER — AMOXICILLIN-POT CLAVULANATE 875-125 MG PO TABS: 1.0000 | ORAL_TABLET | Freq: Two times a day (BID) | ORAL | 0 refills | Status: DC

## 2019-11-16 NOTE — Progress Notes (Signed)
Patient ID: Sherry Garrett, female    DOB: [birth year], 44 y.o.   MRN: [id number]   Chief Complaint  Patient presents with  . Sinusitis   Subjective:    HPI  sinus symptoms for a couple of days. Roaring sound in left ear. Tried allergy meds and mucinex.    Medical History Amberia has a past medical history of Benign essential hypertension (2019), Genital warts, Hematuria, HPV (human papilloma virus) infection, and Pre-eclampsia.   Outpatient Encounter Medications as of 11/16/2019  Medication Sig  . losartan (COZAAR) 50 MG tablet TAKE 1 TABLET BY MOUTH DAILY  . medroxyPROGESTERone Acetate 150 MG/ML [name] INJECT 1 ML INTO THE MUSCLE EVERY 3 MONTHS.  . Triamcinolone Acetonide (NASACORT ALLERGY 24HR NA) Place into the nose as needed.  . [DISCONTINUED] losartan-hydrochlorothiazide (HYZAAR) 50-12.5 MG tablet Take 1 tablet by mouth daily.  Marland Kitchen amoxicillin-clavulanate (AUGMENTIN) 875-125 MG tablet Take 1 tablet by mouth 2 (two) times daily.   No facility-administered encounter medications on file as of 11/16/2019.     Review of Systems  Constitutional: Negative for chills and fever.  HENT: Positive for sinus pressure, sinus pain and tinnitus. Negative for congestion.        Left ear tinnitus.  Eyes: Negative.   Respiratory: Negative for cough and shortness of breath.   Cardiovascular: Negative for chest pain.  Gastrointestinal: Negative.   Musculoskeletal: Negative.   Skin: Negative.   Neurological: Negative.      Vitals Pulse 98   Temp 98.4 F (36.9 C)   Resp 16   SpO2 95%   Objective:   Physical Exam Vitals reviewed.  Constitutional:      Appearance: Normal appearance.  HENT:     Right Ear: Tympanic membrane normal.     Left Ear: Tympanic membrane normal.     Nose: Nose normal.     Mouth/Throat:     Mouth: Mucous membranes are moist.     Pharynx: Oropharynx is clear.  Cardiovascular:     Rate and Rhythm: Normal rate and regular rhythm.     Heart sounds: Normal  heart sounds.  Pulmonary:     Effort: Pulmonary effort is normal.     Breath sounds: Normal breath sounds.  Skin:    General: Skin is warm and dry.  Neurological:     Mental Status: She is alert and oriented to person, place, and time.  Psychiatric:        Mood and Affect: Mood normal.        Behavior: Behavior normal.      Assessment and Plan   1. Acute rhinosinusitis - amoxicillin-clavulanate (AUGMENTIN) 875-125 MG tablet; Take 1 tablet by mouth 2 (two) times daily.  Dispense: 20 tablet; Refill: 0     This is a new problem.  Started 5-6 days ago.   Associated symptoms include: facial tenderness under left eye and behind nose. Tinnitus L>R ear.   Pertinent negatives include: no fever, no cough, no shortness of breath.  Treatments tried: mineral oil in left ear. Takes allergy medication daily.  Today: presents for an outside visit to have ears looked at. TMs are normal. Significant tenderness in maxillary sinus area and behind nose (per patient). Has history of sinus infection.   Will start antibiotic and let us know if the tinnitus resolves. If it does not she will message me and we will place an ENT referral for further assessment and work up. She will also change her allergy medication, as she has been  on this same one for a while.  Agrees with plan of care discussed today. Understands warning signs to seek further care: fever, increased ear pain, changes in hearing.  Understands to follow-up if symptoms do not improve or if anything changes.   Dorena Bodo, FNP-C    11/16/2019

## 2019-11-17 ENCOUNTER — Ambulatory Visit: Payer: Federal, State, Local not specified - PPO | Admitting: Adult Health

## 2019-11-17 ENCOUNTER — Encounter: Payer: Self-pay | Admitting: Adult Health

## 2019-11-17 VITALS — BP 138/88 | HR 98 | Ht 62.75 in | Wt 179.5 lb

## 2019-11-17 DIAGNOSIS — I10 Essential (primary) hypertension: Secondary | ICD-10-CM | POA: Diagnosis not present

## 2019-11-17 MED ORDER — LOSARTAN POTASSIUM-HCTZ 50-12.5 MG PO TABS: 1.0000 | ORAL_TABLET | Freq: Every day | ORAL | 3 refills | Status: DC

## 2019-11-17 NOTE — Progress Notes (Signed)
°  Subjective:     Patient ID: Sherry Garrett, female   DOB: 12/21/1975, 44 y.o.   MRN: [id number]  HPI Sherry Garrett is a 44 year old white female,married, [phone] in to get BP meds refilled, she has been on cozaar and should have been on Hyzaar, she thinks pharmacy messed up when she called for refill.Shehas roaring in left ear and is being treated for sinus infection. PCP is Dr Ladona Ridgel  Review of Systems Roaring in ear Reviewed past medical,surgical, social and family history. Reviewed medications and allergies.     Objective:   Physical Exam BP 138/88 (BP Location: Left Arm, Patient Position: Sitting, Cuff Size: Normal)    Pulse 98    Ht 5' 2.75" (1.594 m)    Wt 179 lb 8 oz (81.4 kg)    BMI 32.05 kg/m  Skin warm and dry. Lungs: clear to ausculation bilaterally. Cardiovascular: regular rate and rhythm.  Upstream - 11/17/19 1607      Pregnancy Intention Screening   Does the patient want to become pregnant in the next year? No    Does the patient's partner want to become pregnant in the next year? No    Would the patient like to discuss contraceptive options today? No      Contraception Wrap Up   Current Method Hormonal Injection    End Method Hormonal Injection    Contraception Counseling Provided No             Assessment:     1. Essential hypertension Will refill Hyzaar Meds ordered this encounter  Medications   losartan-hydrochlorothiazide (HYZAAR) 50-12.5 MG tablet    Sig: Take 1 tablet by mouth daily.    Dispense:  90 tablet    Refill:  3    Order Specific Question:   Supervising Provider    Answer:   Lazaro Arms [[id number]]      Plan:     Pap and physical in 6 months

## 2019-11-22 ENCOUNTER — Telehealth: Payer: Self-pay | Admitting: Adult Health

## 2019-11-22 NOTE — Telephone Encounter (Signed)
Patient called stating that she was told by Victorino Dike if she still had the pain in her ear to call back today. Pt states she still has pain, please contact pt

## 2019-11-23 ENCOUNTER — Telehealth: Payer: Self-pay

## 2019-11-23 NOTE — Telephone Encounter (Signed)
Pt called back stating that she had not received a return call about her ear pain that she and Victorino Dike discussed

## 2019-11-23 NOTE — Telephone Encounter (Signed)
Ear not any better after taking antibiotics from PCP, try 250 mg magnesium and call ENT Dr Suszanne Conners is in [place].

## 2019-11-25 DIAGNOSIS — H9312 Tinnitus, left ear: Secondary | ICD-10-CM | POA: Diagnosis not present

## 2019-11-25 DIAGNOSIS — H9042 Sensorineural hearing loss, unilateral, left ear, with unrestricted hearing on the contralateral side: Secondary | ICD-10-CM | POA: Diagnosis not present

## 2019-11-29 ENCOUNTER — Other Ambulatory Visit: Payer: Self-pay | Admitting: Otolaryngology

## 2019-11-29 DIAGNOSIS — H918X9 Other specified hearing loss, unspecified ear: Secondary | ICD-10-CM

## 2019-12-18 ENCOUNTER — Other Ambulatory Visit: Payer: Self-pay

## 2019-12-18 ENCOUNTER — Ambulatory Visit
Admission: RE | Admit: 2019-12-18 | Discharge: 2019-12-18 | Disposition: A | Discharge status: Home / Self Care | Payer: Federal, State, Local not specified - PPO | Source: Ambulatory Visit | Attending: Otolaryngology | Admitting: Otolaryngology

## 2019-12-18 DIAGNOSIS — H748X1 Other specified disorders of right middle ear and mastoid: Secondary | ICD-10-CM | POA: Diagnosis not present

## 2019-12-18 DIAGNOSIS — H918X3 Other specified hearing loss, bilateral: Secondary | ICD-10-CM

## 2019-12-18 DIAGNOSIS — J320 Chronic maxillary sinusitis: Secondary | ICD-10-CM | POA: Diagnosis not present

## 2019-12-18 DIAGNOSIS — J3489 Other specified disorders of nose and nasal sinuses: Secondary | ICD-10-CM | POA: Diagnosis not present

## 2019-12-18 DIAGNOSIS — J341 Cyst and mucocele of nose and nasal sinus: Secondary | ICD-10-CM | POA: Diagnosis not present

## 2019-12-18 DIAGNOSIS — H918X9 Other specified hearing loss, unspecified ear: Secondary | ICD-10-CM

## 2019-12-18 MED ORDER — GADOBENATE DIMEGLUMINE 529 MG/ML IV SOLN
17.0000 mL | Freq: Once | INTRAVENOUS | Status: AC | PRN
  Administered 2019-12-18: 17 mL via INTRAVENOUS

## 2020-01-26 ENCOUNTER — Ambulatory Visit: Payer: Federal, State, Local not specified - PPO

## 2020-01-30 ENCOUNTER — Other Ambulatory Visit: Payer: Self-pay | Admitting: Women's Health

## 2020-01-31 ENCOUNTER — Other Ambulatory Visit: Payer: Self-pay | Admitting: Women's Health

## 2020-02-01 ENCOUNTER — Other Ambulatory Visit: Payer: Self-pay

## 2020-02-01 ENCOUNTER — Ambulatory Visit (INDEPENDENT_AMBULATORY_CARE_PROVIDER_SITE_OTHER): Payer: Federal, State, Local not specified - PPO | Admitting: *Deleted

## 2020-02-01 DIAGNOSIS — Z3042 Encounter for surveillance of injectable contraceptive: Secondary | ICD-10-CM | POA: Diagnosis not present

## 2020-02-01 MED ORDER — MEDROXYPROGESTERONE ACETATE 150 MG/ML IM SUSP
150.0000 mg | Freq: Once | INTRAMUSCULAR | Status: AC
  Administered 2020-02-01: 150 mg via INTRAMUSCULAR

## 2020-02-01 NOTE — Progress Notes (Signed)
   NURSE VISIT- INJECTION  SUBJECTIVE:  Sherry Garrett is a 44 y.o. [phone] female here for a Depo Provera for contraception/period management. She is a GYN patient.   OBJECTIVE:  There were no vitals taken for this visit.  Appears well, in no apparent distress  Injection administered in: Right deltoid  Meds ordered this encounter  Medications  . medroxyPROGESTERone (DEPO-PROVERA) injection 150 mg    ASSESSMENT: GYN patient Depo Provera for contraception/period management PLAN: Follow-up: in 11-13 weeks for next Depo   NATASHIA ROSEMAN  02/01/2020 9:30 AM

## 2020-04-23 ENCOUNTER — Other Ambulatory Visit: Payer: Self-pay | Admitting: Women's Health

## 2020-04-23 ENCOUNTER — Other Ambulatory Visit: Payer: Self-pay | Admitting: Adult Health

## 2020-04-25 ENCOUNTER — Ambulatory Visit (INDEPENDENT_AMBULATORY_CARE_PROVIDER_SITE_OTHER): Payer: Federal, State, Local not specified - PPO | Admitting: *Deleted

## 2020-04-25 ENCOUNTER — Other Ambulatory Visit: Payer: Self-pay

## 2020-04-25 DIAGNOSIS — Z308 Encounter for other contraceptive management: Secondary | ICD-10-CM

## 2020-04-25 MED ORDER — MEDROXYPROGESTERONE ACETATE 150 MG/ML IM SUSP
150.0000 mg | Freq: Once | INTRAMUSCULAR | Status: AC
  Administered 2020-04-25: 150 mg via INTRAMUSCULAR

## 2020-04-25 NOTE — Progress Notes (Signed)
   NURSE VISIT- INJECTION  SUBJECTIVE:  Sherry Garrett is a 44 y.o. [phone] female here for a Depo Provera for contraception/period management. She is a GYN patient.   OBJECTIVE:  There were no vitals taken for this visit.  Appears well, in no apparent distress  Injection administered in: Left deltoid  Meds ordered this encounter  Medications  . medroxyPROGESTERone (DEPO-PROVERA) injection 150 mg    ASSESSMENT: GYN patient Depo Provera for contraception/period management PLAN: Follow-up: in 11-13 weeks for next Depo and pap/physical.   Malachy Mood  04/25/2020 4:29 PM

## 2020-07-10 ENCOUNTER — Telehealth: Payer: Self-pay | Admitting: *Deleted

## 2020-07-10 MED ORDER — MEDROXYPROGESTERONE ACETATE 150 MG/ML IM SUSY: PREFILLED_SYRINGE | INTRAMUSCULAR | 0 refills | Status: DC

## 2020-07-10 NOTE — Telephone Encounter (Signed)
Patient is scheduled for P/P on June 8 but is due for depo.  She is requesting a refill be sent to [facility].

## 2020-07-10 NOTE — Telephone Encounter (Signed)
Refilled depo 

## 2020-07-11 ENCOUNTER — Ambulatory Visit (INDEPENDENT_AMBULATORY_CARE_PROVIDER_SITE_OTHER): Payer: Federal, State, Local not specified - PPO | Admitting: *Deleted

## 2020-07-11 ENCOUNTER — Other Ambulatory Visit: Payer: Self-pay

## 2020-07-11 DIAGNOSIS — Z3042 Encounter for surveillance of injectable contraceptive: Secondary | ICD-10-CM | POA: Diagnosis not present

## 2020-07-11 MED ORDER — MEDROXYPROGESTERONE ACETATE 150 MG/ML IM SUSP
150.0000 mg | Freq: Once | INTRAMUSCULAR | Status: AC
  Administered 2020-07-11: 150 mg via INTRAMUSCULAR

## 2020-07-11 NOTE — Progress Notes (Signed)
   NURSE VISIT- INJECTION  SUBJECTIVE:  Sherry Garrett is a 45 y.o. [phone] female here for a Depo Provera for contraception/period management. She is a GYN patient.   OBJECTIVE:  There were no vitals taken for this visit.  Appears well, in no apparent distress  Injection administered in: Right deltoid  Meds ordered this encounter  Medications  . medroxyPROGESTERone (DEPO-PROVERA) injection 150 mg    ASSESSMENT: GYN patient Depo Provera for contraception/period management PLAN: Follow-up: in 11-13 weeks for next Depo   Jobe Marker  07/11/2020 4:13 PM

## 2020-08-21 ENCOUNTER — Other Ambulatory Visit: Payer: Self-pay | Admitting: Adult Health

## 2020-08-22 ENCOUNTER — Encounter: Payer: Self-pay | Admitting: Adult Health

## 2020-08-22 ENCOUNTER — Other Ambulatory Visit: Payer: Self-pay

## 2020-08-22 ENCOUNTER — Other Ambulatory Visit (HOSPITAL_COMMUNITY)
Admission: RE | Admit: 2020-08-22 | Discharge: 2020-08-22 | Disposition: A | Discharge status: Home / Self Care | Payer: Federal, State, Local not specified - PPO | Source: Ambulatory Visit | Attending: Adult Health | Admitting: Adult Health

## 2020-08-22 ENCOUNTER — Ambulatory Visit (INDEPENDENT_AMBULATORY_CARE_PROVIDER_SITE_OTHER): Payer: Federal, State, Local not specified - PPO | Admitting: Adult Health

## 2020-08-22 VITALS — BP 119/85 | HR 91 | Ht 62.75 in | Wt 167.0 lb

## 2020-08-22 DIAGNOSIS — Z1211 Encounter for screening for malignant neoplasm of colon: Secondary | ICD-10-CM | POA: Diagnosis not present

## 2020-08-22 DIAGNOSIS — I10 Essential (primary) hypertension: Secondary | ICD-10-CM

## 2020-08-22 DIAGNOSIS — Z01419 Encounter for gynecological examination (general) (routine) without abnormal findings: Secondary | ICD-10-CM | POA: Insufficient documentation

## 2020-08-22 DIAGNOSIS — Z3042 Encounter for surveillance of injectable contraceptive: Secondary | ICD-10-CM | POA: Diagnosis not present

## 2020-08-22 DIAGNOSIS — Z1231 Encounter for screening mammogram for malignant neoplasm of breast: Secondary | ICD-10-CM

## 2020-08-22 LAB — HEMOCCULT GUIAC POC 1CARD (OFFICE): Fecal Occult Blood, POC: NEGATIVE

## 2020-08-22 MED ORDER — LOSARTAN POTASSIUM-HCTZ 50-12.5 MG PO TABS: 1.0000 | ORAL_TABLET | Freq: Every day | ORAL | 3 refills | Status: DC

## 2020-08-22 NOTE — Progress Notes (Signed)
Patient ID: Sherry Garrett, female   DOB: 04-03-1975, 45 y.o.   MRN: [id number] History of Present Illness: Sherry Garrett is a 45 year old white female,married, [id number] in for a well woman gyn exam and pap. She is still working for [employer] in [place]. PCP is Dr Ladona Ridgel.   Current Medications, Allergies, Past Medical History, Past Surgical History, Family History and Social History were reviewed in [employer] record.     Review of Systems: Patient denies any headaches, hearing loss, fatigue, blurred vision, shortness of breath, chest pain, abdominal pain, problems with bowel movements, urination, or intercourse. No joint pain or mood swings. Feels anxious at times    Physical Exam:BP 119/85 (BP Location: Left Arm, Patient Position: Sitting, Cuff Size: Normal)   Pulse 91   Ht 5' 2.75" (1.594 m)   Wt 167 lb (75.8 kg)   BMI 29.82 kg/m  General:  Well developed, well nourished, no acute distress Skin:  Warm and dry Neck:  Midline trachea, normal thyroid, good ROM, no lymphadenopathy Lungs; Clear to auscultation bilaterally Breast:  No dominant palpable mass, retraction, or nipple discharge Cardiovascular: Regular rate and rhythm Abdomen:  Soft, non tender, no hepatosplenomegaly Pelvic:  External genitalia is normal in appearance, no lesions.  The vagina is normal in appearance. Urethra has no lesions or masses. The cervix is bulbous. Pap with HR HPV genotyping performed. Uterus is felt to be normal size, shape, and contour.  No adnexal masses or tenderness noted.Bladder is non tender, no masses felt. Rectal: Good sphincter tone, no polyps, or hemorrhoids felt.  Hemoccult negative. Extremities/musculoskeletal:  No swelling or varicosities noted, no clubbing or cyanosis Psych:  No mood changes, alert and cooperative,seems happy AA is 2 Fall risk is low Depression screen [facility] 2/9 08/22/2020 11/25/2018 04/15/2018  Decreased Interest 1 0 0  Down, Depressed, Hopeless 0 1 0  PHQ - 2  Score 1 1 0  Altered sleeping 0 - -  Tired, decreased energy 2 - -  Change in appetite 0 - -  Feeling bad or failure about yourself  0 - -  Trouble concentrating 0 - -  Moving slowly or fidgety/restless 0 - -  Suicidal thoughts 0 - -  PHQ-9 Score 3 - -   GAD 7 : Generalized Anxiety Score 08/22/2020  Nervous, Anxious, on Edge 1  Control/stop worrying 1  Worry too much - different things 1  Trouble relaxing 1  Restless 0  Easily annoyed or irritable 1  Afraid - awful might happen 2  Total GAD 7 Score 7  She declines medication at this time. Take time for self.   Upstream - 08/22/20 0951      Pregnancy Intention Screening   Does the patient want to become pregnant in the next year? No    Does the patient's partner want to become pregnant in the next year? No    Would the patient like to discuss contraceptive options today? No      Contraception Wrap Up   Current Method Hormonal Injection    End Method Hormonal Injection    Contraception Counseling Provided No         Examination chaperoned by Faith Rogue LPN  Impression and Plan: 1. Encounter for gynecological examination with Papanicolaou smear of cervix Pap sent Physical in 1 year Pap in 3 if normal Will check labs - Cytology - PAP( [facility]) - CBC - Comprehensive metabolic panel - TSH - Lipid panel Colonoscopy advised  She is not  ready to quit smoking   2. Encounter for screening fecal occult blood testing - POCT occult blood stool  3. Encounter for surveillance of injectable contraceptive Has refills on depo  4. Essential hypertension Meds ordered this encounter  Medications  . losartan-hydrochlorothiazide (HYZAAR) 50-12.5 MG tablet    Sig: Take 1 tablet by mouth daily.    Dispense:  90 tablet    Refill:  3    Order Specific Question:   Supervising Provider    Answer:   Despina Hidden, LUTHER H [[id number]]    5. Screening mammogram for breast cancer Pt to call for appt at [facility]  - MM 3D SCREEN BREAST  BILATERAL; Future

## 2020-08-23 LAB — CBC
Hematocrit: 43.5 % (ref 34.0–46.6)
Hemoglobin: 15.1 g/dL (ref 11.1–15.9)
MCH: 30.9 pg (ref 26.6–33.0)
MCHC: 34.7 g/dL (ref 31.5–35.7)
MCV: 89 fL (ref 79–97)
Platelets: 363 10*3/uL (ref 150–450)
RBC: 4.89 x10E6/uL (ref 3.77–5.28)
RDW: 12.1 % (ref 11.7–15.4)
WBC: 13.1 10*3/uL — ABNORMAL HIGH (ref 3.4–10.8)

## 2020-08-23 LAB — COMPREHENSIVE METABOLIC PANEL
ALT: 10 IU/L (ref 0–32)
AST: 11 IU/L (ref 0–40)
Albumin/Globulin Ratio: 1.7 (ref 1.2–2.2)
Albumin: 4.7 g/dL (ref 3.8–4.8)
Alkaline Phosphatase: 82 IU/L (ref 44–121)
BUN/Creatinine Ratio: 11 (ref 9–23)
BUN: 10 mg/dL (ref 6–24)
Bilirubin Total: 0.3 mg/dL (ref 0.0–1.2)
CO2: 20 mmol/L (ref 20–29)
Calcium: 10.2 mg/dL (ref 8.7–10.2)
Chloride: 101 mmol/L (ref 96–106)
Creatinine, Ser: 0.92 mg/dL (ref 0.57–1.00)
Globulin, Total: 2.7 g/dL (ref 1.5–4.5)
Glucose: 84 mg/dL (ref 65–99)
Potassium: 4 mmol/L (ref 3.5–5.2)
Sodium: 139 mmol/L (ref 134–144)
Total Protein: 7.4 g/dL (ref 6.0–8.5)
eGFR: 78 mL/min/{1.73_m2} (ref >59)

## 2020-08-23 LAB — LIPID PANEL
Chol/HDL Ratio: 5.9 ratio — ABNORMAL HIGH (ref 0.0–4.4)
Cholesterol, Total: 188 mg/dL (ref 100–199)
HDL: 32 mg/dL — ABNORMAL LOW (ref >39)
LDL Chol Calc (NIH): 124 mg/dL — ABNORMAL HIGH (ref 0–99)
Triglycerides: 177 mg/dL — ABNORMAL HIGH (ref 0–149)
VLDL Cholesterol Cal: 32 mg/dL (ref 5–40)

## 2020-08-23 LAB — TSH: TSH: 1.05 u[IU]/mL (ref 0.450–4.500)

## 2020-08-24 LAB — CYTOLOGY - PAP
Comment: NEGATIVE
Diagnosis: NEGATIVE
High risk HPV: NEGATIVE

## 2020-08-27 ENCOUNTER — Ambulatory Visit (HOSPITAL_COMMUNITY): Admission: RE | Admit: 2020-08-27 | Payer: Federal, State, Local not specified - PPO | Source: Ambulatory Visit

## 2020-08-27 ENCOUNTER — Encounter (HOSPITAL_COMMUNITY): Payer: Self-pay

## 2020-08-27 ENCOUNTER — Other Ambulatory Visit (HOSPITAL_COMMUNITY): Payer: Self-pay | Admitting: Adult Health

## 2020-08-27 DIAGNOSIS — N63 Unspecified lump in unspecified breast: Secondary | ICD-10-CM

## 2020-10-02 ENCOUNTER — Ambulatory Visit (HOSPITAL_COMMUNITY)
Admission: RE | Admit: 2020-10-02 | Discharge: 2020-10-02 | Disposition: A | Discharge status: Home / Self Care | Payer: Federal, State, Local not specified - PPO | Source: Ambulatory Visit | Attending: Adult Health | Admitting: Adult Health

## 2020-10-02 ENCOUNTER — Other Ambulatory Visit: Payer: Self-pay

## 2020-10-02 DIAGNOSIS — N63 Unspecified lump in unspecified breast: Secondary | ICD-10-CM | POA: Insufficient documentation

## 2020-10-02 DIAGNOSIS — R922 Inconclusive mammogram: Secondary | ICD-10-CM | POA: Diagnosis not present

## 2020-10-03 ENCOUNTER — Ambulatory Visit (INDEPENDENT_AMBULATORY_CARE_PROVIDER_SITE_OTHER): Payer: Federal, State, Local not specified - PPO

## 2020-10-03 DIAGNOSIS — Z3042 Encounter for surveillance of injectable contraceptive: Secondary | ICD-10-CM | POA: Diagnosis not present

## 2020-10-03 MED ORDER — MEDROXYPROGESTERONE ACETATE 150 MG/ML IM SUSY: PREFILLED_SYRINGE | Freq: Once | INTRAMUSCULAR | Status: AC

## 2020-10-03 NOTE — Progress Notes (Signed)
   NURSE VISIT- INJECTION  SUBJECTIVE:  Sherry Garrett is a 45 y.o. [phone] female here for a Depo Provera for contraception/period management. She is a GYN patient.   OBJECTIVE:  There were no vitals taken for this visit.  Appears well, in no apparent distress  Injection administered in: Left deltoid  Meds ordered this encounter  Medications   medroxyPROGESTERone Acetate [name]    ASSESSMENT: GYN patient Depo Provera for contraception/period management PLAN: Follow-up: in 11-13 weeks for next Depo   Sherry Garrett A Sherry Garrett  10/03/2020 4:31 PM

## 2020-11-08 DIAGNOSIS — L570 Actinic keratosis: Secondary | ICD-10-CM | POA: Diagnosis not present

## 2020-11-08 DIAGNOSIS — L814 Other melanin hyperpigmentation: Secondary | ICD-10-CM | POA: Diagnosis not present

## 2020-11-08 DIAGNOSIS — D225 Melanocytic nevi of trunk: Secondary | ICD-10-CM | POA: Diagnosis not present

## 2020-11-08 DIAGNOSIS — D2239 Melanocytic nevi of other parts of face: Secondary | ICD-10-CM | POA: Diagnosis not present

## 2020-11-08 DIAGNOSIS — L578 Other skin changes due to chronic exposure to nonionizing radiation: Secondary | ICD-10-CM | POA: Diagnosis not present

## 2020-12-24 ENCOUNTER — Other Ambulatory Visit: Payer: Self-pay | Admitting: Adult Health

## 2020-12-25 ENCOUNTER — Ambulatory Visit (INDEPENDENT_AMBULATORY_CARE_PROVIDER_SITE_OTHER): Payer: Federal, State, Local not specified - PPO | Admitting: *Deleted

## 2020-12-25 ENCOUNTER — Other Ambulatory Visit: Payer: Self-pay

## 2020-12-25 DIAGNOSIS — Z3042 Encounter for surveillance of injectable contraceptive: Secondary | ICD-10-CM | POA: Diagnosis not present

## 2020-12-25 MED ORDER — MEDROXYPROGESTERONE ACETATE 150 MG/ML IM SUSP
150.0000 mg | Freq: Once | INTRAMUSCULAR | Status: AC
  Administered 2020-12-25: 150 mg via INTRAMUSCULAR

## 2020-12-25 NOTE — Progress Notes (Signed)
   NURSE VISIT- INJECTION  SUBJECTIVE:  Sherry Garrett is a 45 y.o. [phone] female here for a Depo Provera for contraception/period management. She is a GYN patient.   OBJECTIVE:  There were no vitals taken for this visit.  Appears well, in no apparent distress  Injection administered in: Right deltoid  No orders of the defined types were placed in this encounter.   ASSESSMENT: GYN patient Depo Provera for contraception/period management PLAN: Follow-up: in 11-13 weeks for next Depo   Sherry Garrett  12/25/2020 4:12 PM

## 2021-03-21 ENCOUNTER — Other Ambulatory Visit: Payer: Self-pay

## 2021-03-21 ENCOUNTER — Ambulatory Visit (INDEPENDENT_AMBULATORY_CARE_PROVIDER_SITE_OTHER): Payer: Federal, State, Local not specified - PPO | Admitting: *Deleted

## 2021-03-21 DIAGNOSIS — Z3042 Encounter for surveillance of injectable contraceptive: Secondary | ICD-10-CM

## 2021-03-21 MED ORDER — MEDROXYPROGESTERONE ACETATE 150 MG/ML IM SUSP
150.0000 mg | Freq: Once | INTRAMUSCULAR | Status: AC
  Administered 2021-03-21: 150 mg via INTRAMUSCULAR

## 2021-03-21 NOTE — Progress Notes (Signed)
° °  NURSE VISIT- INJECTION  SUBJECTIVE:  Sherry Garrett is a 46 y.o. [phone] female here for a Depo Provera for contraception/period management. She is a GYN patient.   OBJECTIVE:  There were no vitals taken for this visit.  Appears well, in no apparent distress  Injection administered in: Right deltoid  Meds ordered this encounter  Medications   medroxyPROGESTERone (DEPO-PROVERA) injection 150 mg    ASSESSMENT: GYN patient Depo Provera for contraception/period management PLAN: Follow-up: in 11-13 weeks for next Depo   Sherry Garrett  03/21/2021 4:36 PM

## 2021-03-28 ENCOUNTER — Ambulatory Visit (INDEPENDENT_AMBULATORY_CARE_PROVIDER_SITE_OTHER): Payer: Federal, State, Local not specified - PPO | Admitting: Nurse Practitioner

## 2021-03-28 ENCOUNTER — Other Ambulatory Visit: Payer: Self-pay

## 2021-03-28 ENCOUNTER — Encounter: Payer: Self-pay | Admitting: Nurse Practitioner

## 2021-03-28 VITALS — BP 123/86 | HR 89 | Temp 99.1°F | Ht 62.75 in | Wt 172.6 lb

## 2021-03-28 DIAGNOSIS — K119 Disease of salivary gland, unspecified: Secondary | ICD-10-CM

## 2021-03-28 MED ORDER — NICOTINE POLACRILEX 4 MG MT GUM: 4.0000 mg | CHEWING_GUM | OROMUCOSAL | 0 refills | Status: DC | PRN

## 2021-03-28 MED ORDER — DOXYCYCLINE HYCLATE 100 MG PO TABS: 100.0000 mg | ORAL_TABLET | Freq: Two times a day (BID) | ORAL | 0 refills | Status: AC

## 2021-03-28 NOTE — Progress Notes (Signed)
° °  Subjective:    Patient ID: Sherry Garrett, female    DOB: February 17, 1976, 46 y.o.   MRN: [id number]  HPI Patient is coming in for concerns about blocked salvia gland. Patient reports that she was diagnosed with a block salivary gland about 3-4 years ago. She has not had any issues until about 6 days ago. On Friday she noticed the left side of her face was swollen which got progressively worse. She experienced swelling and pain. She sucked on hard candy, drank more water, used warm compresses, and massaged the area for relief. On Tuesday (day 4) she states that the salivary gland cleared and discharged a foul tasting and cloudy like fluid. Swelling and tenderness has decreased since then, however, patient is concerned about why she had a block salivary gland again. Patient denies fever, chills, redness, pain.   Patient has hx of seasonal/environmental allergies and uses several allergy medications for relief.    Review of Systems  All other systems reviewed and are negative.     Objective:   Physical Exam Constitutional:      General: She is not in acute distress.    Appearance: Normal appearance. She is normal weight. She is not ill-appearing or toxic-appearing.  HENT:     Head: Normocephalic.     Mouth/Throat:     Lips: Pink.     Mouth: Mucous membranes are moist.     Palate: No mass and lesions.     Pharynx: Oropharynx is clear. Uvula midline. No pharyngeal swelling, oropharyngeal exudate, posterior oropharyngeal erythema or uvula swelling.     Tonsils: No tonsillar exudate or tonsillar abscesses. 0 on the right. 0 on the left.  Neck:     Thyroid: No thyroid mass, thyromegaly or thyroid tenderness.     Comments: Left submandibular and parotid swelling. Mildly tender to palpation. No redness or streaking noted.  Musculoskeletal:     Cervical back: Full passive range of motion without pain, normal range of motion and neck supple.  Lymphadenopathy:     Cervical: No cervical adenopathy.   Neurological:     Mental Status: She is alert.          Assessment & Plan:  1. Salivary gland dysfunction - Likely a salivary gland blockage, infection could not be r/o. - Considered Sjogren's syndrome - Patient covered for potential infection - To prevent blockage in the future drink plenty water, smoking cessation, use Allegra and Loratadine for allergy relief. Try to minimize use of Zyrtec and Benadryl to prevent anticholinergic effects.  - Nicorette gum ordered to aid in smoking cessation. - MRI in 2021 noted mucosal thickening and mucous rentention cyst(s) - If symptoms return, will send to ENT for evaluation.  - RTC if symptoms return or worsen.

## 2021-05-23 IMAGING — MR MR HEAD WO/W CM
11 of 12 series · 36 of 48 positions shown · IV contrast (multihance)
Comparison: None.

CLINICAL DATA: 44-year-old female with low frequency hearing loss
and tinnitus right greater than left. No known injury.

EXAM:
MRI HEAD WITHOUT AND WITH CONTRAST
TECHNIQUE: Multiplanar, multiecho pulse sequences of the brain and surrounding
structures were obtained without and with intravenous contrast.
CONTRAST:  17mL MULTIHANCE GADOBENATE DIMEGLUMINE 529 MG/ML IV SOLN

[Series 2: T1 · sagittal · 5.0mm · 0.45mm/px · 3 of 21 slices shown (1 of 3)]
[im 1/21]
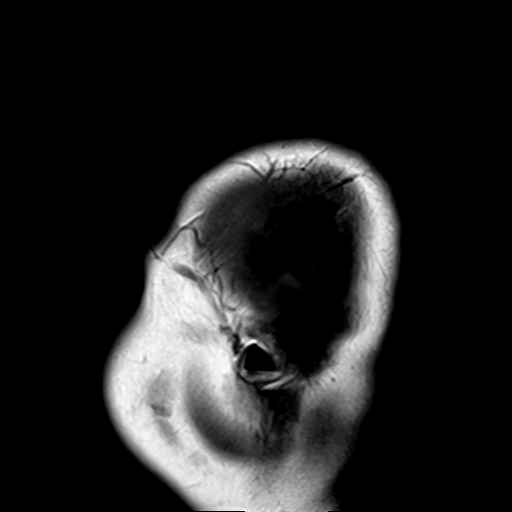
[im 11/21]
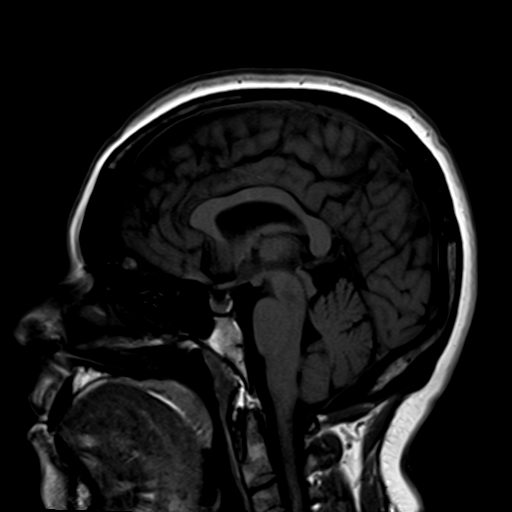
[im 21/21]
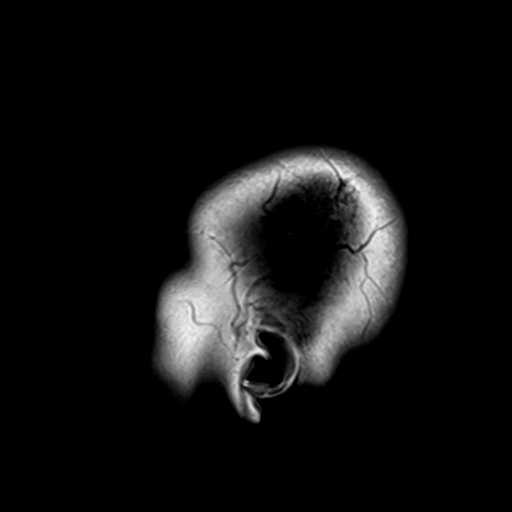

[Series 3: DWI · axial · 3.0mm · 1.80mm/px · z∈[-29,+117]mm · 9 of 100 slices shown (1 of 2)]
[im 1/100]
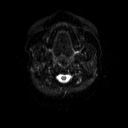
[im 19/100]
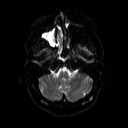
[im 28/100]
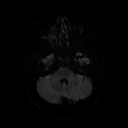
[im 46/100]
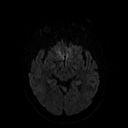
[im 55/100]
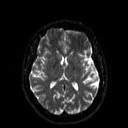
[im 73/100]
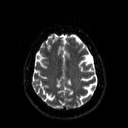
[im 82/100]
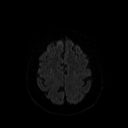
[im 91/100]
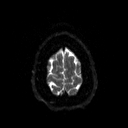
[im 100/100]
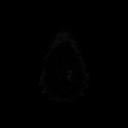

[Series 4: DWI · axial · 3.0mm · 1.80mm/px · z∈[-29,+117]mm · 6 of 50 slices shown (2 of 2)]
[im 1/50]
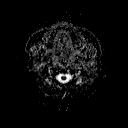
[im 10/50]
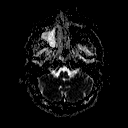
[im 20/50]
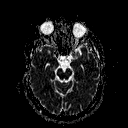
[im 30/50]
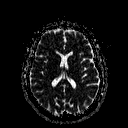
[im 40/50]
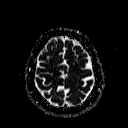
[im 50/50]
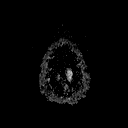

[Series 5: T2 · axial · 5.0mm · 0.45mm/px · z∈[-29,+119]mm · 3 of 24 slices shown]
[im 1/24]
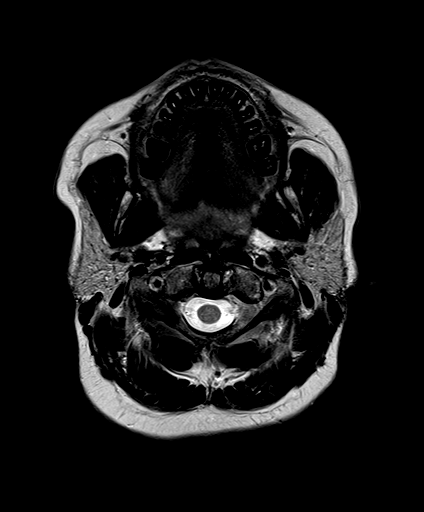
[im 12/24]
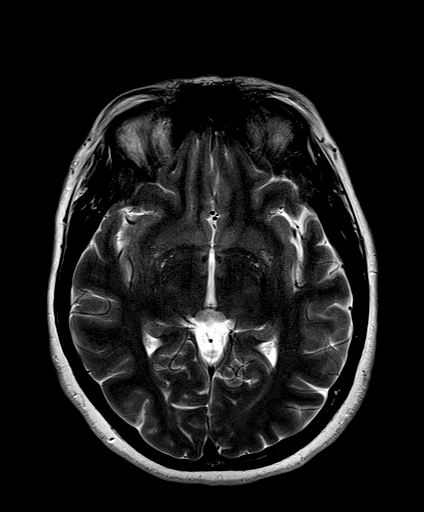
[im 24/24]
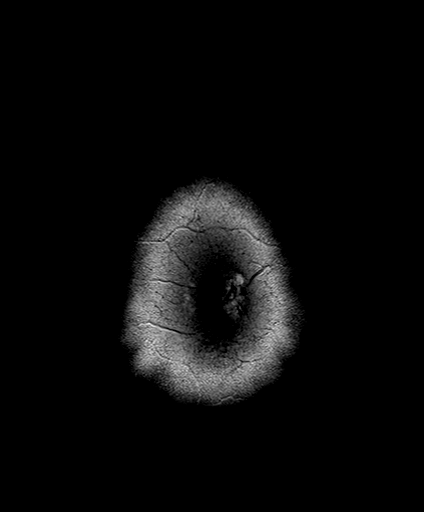

[Series 6: FLAIR · axial · 3.0mm · 0.45mm/px · z∈[-33,+122]mm · 3 of 27 slices shown]
[im 1/27]
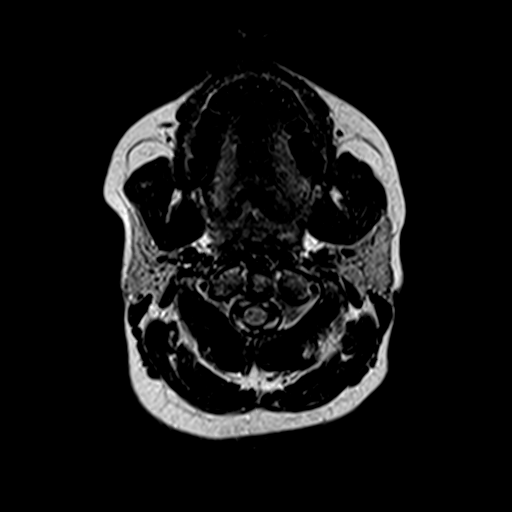
[im 14/27]
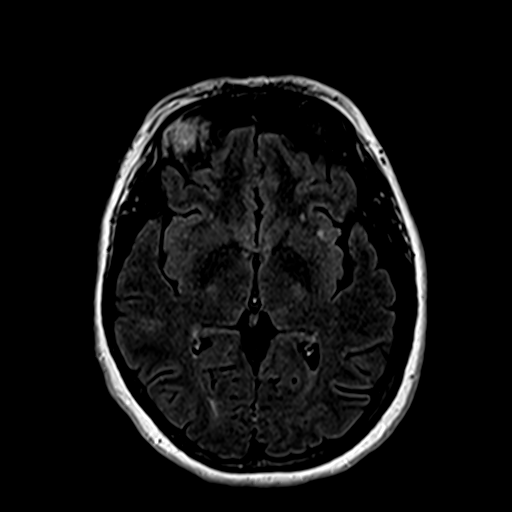
[im 27/27]
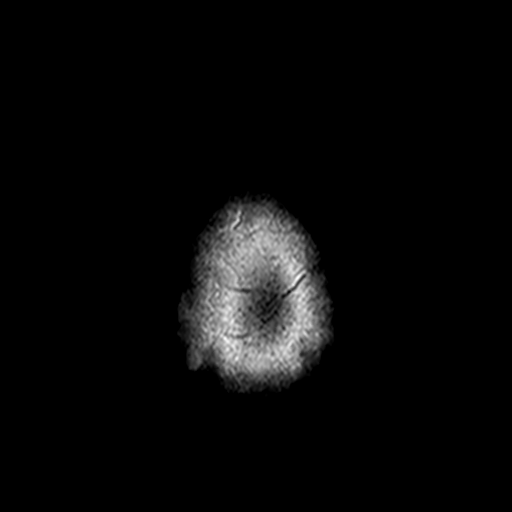

[Series 8: swi_images · axial · 3.0mm · 0.90mm/px · z∈[-25,+115]mm · 5 of 48 slices shown]
[im 1/48]
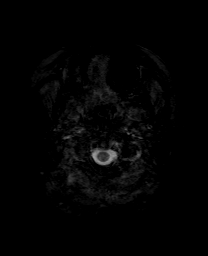
[im 12/48]
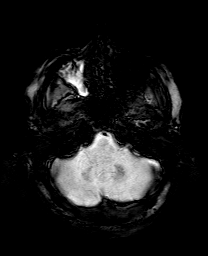
[im 24/48]
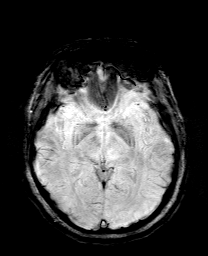
[im 36/48]
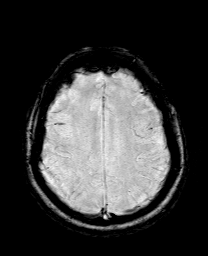
[im 48/48]
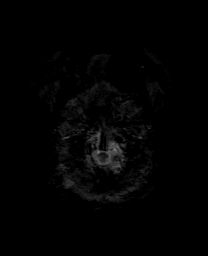

[Series 9: T1 · coronal · 3.0mm · 0.35mm/px · 1 of 13 slices shown (2 of 3)]
[im 1/13]
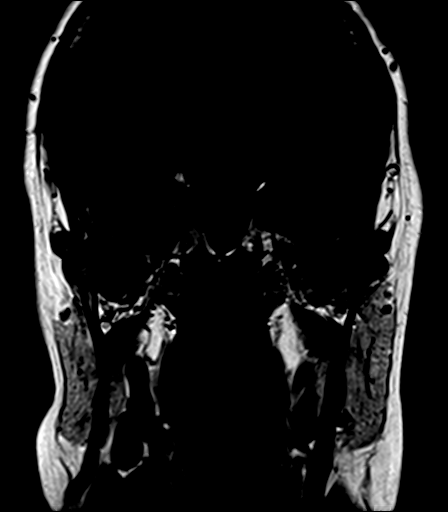

[Series 10: T1 · axial · 3.0mm · 0.35mm/px · 1 of 13 slices shown (3 of 3)]
[im 1/13]
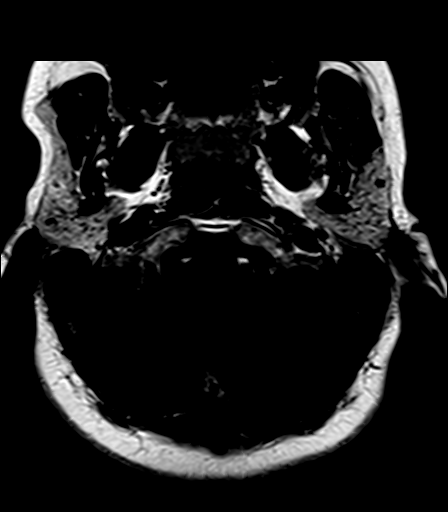

[Series 11: bSSFP · axial · 1.0mm · 0.28mm/px · z∈[-32,-6]mm · 3 of 40 slices shown]
[im 1/40]
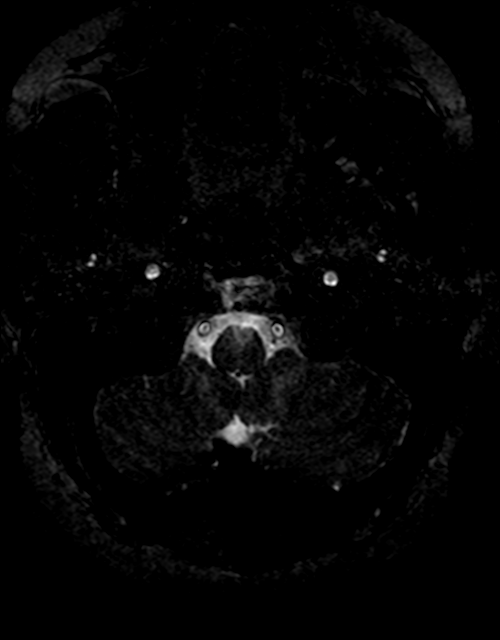
[im 14/40]
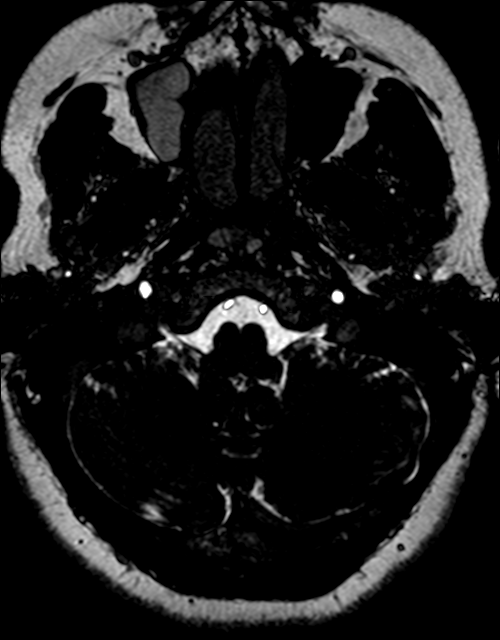
[im 27/40]
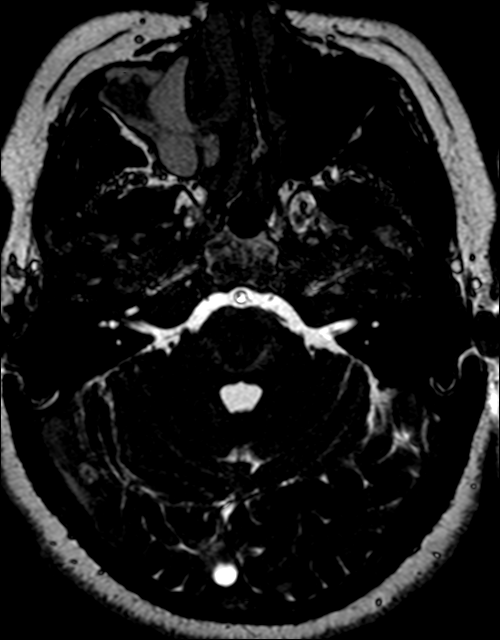

[Series 12: T1 post-contrast · coronal · 3.0mm · 0.35mm/px · 1 of 13 slices shown (1 of 2)]
[im 1/13]
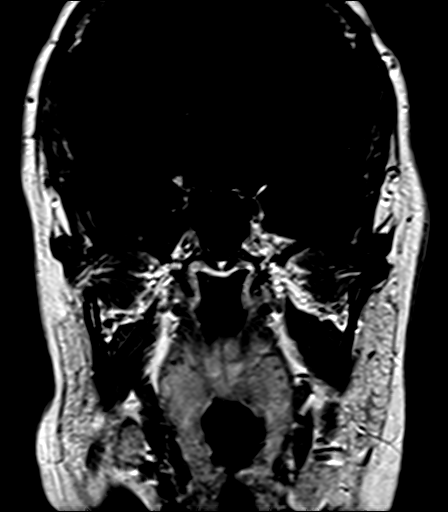

[Series 13: T1 post-contrast · axial · 3.0mm · 0.35mm/px · 1 of 13 slices shown (2 of 2)]
[im 1/13]
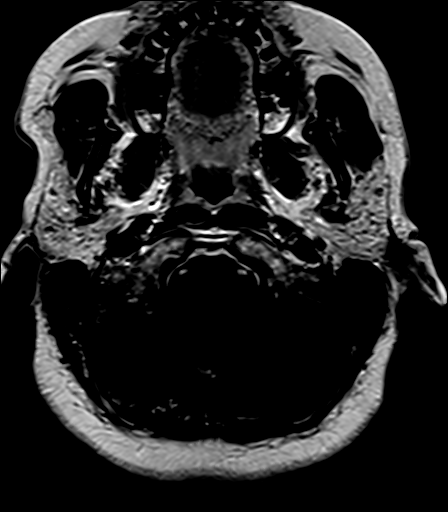

[36 of 48 positions shown; findings below may reference images not displayed]

FINDINGS: Brain: No restricted diffusion to suggest acute infarction. No
midline shift, mass effect, evidence of mass lesion,
ventriculomegaly, extra-axial collection or acute intracranial
hemorrhage. Cervicomedullary junction and pituitary are within
normal limits. Cerebral volume is within normal limits. Mild for age
scattered nonspecific small foci of cerebral white matter T2 and
FLAIR hyperintensity, mostly in the left hemisphere (series 6, image
18). No cortical encephalomalacia. No chronic cerebral blood
products. Deep gray nuclei, brainstem and cerebellum are within
normal limits.

No abnormal enhancement identified.  No dural thickening identified.

Vascular: Major intracranial vascular flow voids are preserved.

Skull and upper cervical spine: Normal visible cervical spine.
Visualized bone marrow signal is within normal limits.

Sinuses/Orbits: Negative orbits. Combination of mucosal thickening
and mucous retention cyst(s) results in complete opacification of
the right maxillary sinus (series 5, image 6). Other paranasal
sinuses are clear.

Other: Dedicated internal auditory imaging. Normal cerebellopontine
angles. Normal bilateral cisternal and intracanalicular 7th and 8th
cranial nerve segments. Symmetric T2 signal in the cochlea and
vestibular structures. Mastoids are well pneumatized, there is trace
posterior air cell fluid only on the right. Normal stylomastoid
foramina. No abnormal enhancement identified. No skull base
abnormality identified. Parotid glands appear normal.
IMPRESSION: 1. Negative internal auditory imaging.
2. No acute intracranial abnormality. Mild for age nonspecific
cerebral white matter signal changes.
3. Complete opacification of the right maxillary sinus from combined
mucosal thickening and mucous retention cyst(s).

## 2021-06-10 ENCOUNTER — Ambulatory Visit (INDEPENDENT_AMBULATORY_CARE_PROVIDER_SITE_OTHER): Payer: Federal, State, Local not specified - PPO | Admitting: *Deleted

## 2021-06-10 ENCOUNTER — Other Ambulatory Visit: Payer: Self-pay

## 2021-06-10 DIAGNOSIS — Z3042 Encounter for surveillance of injectable contraceptive: Secondary | ICD-10-CM | POA: Diagnosis not present

## 2021-06-10 MED ORDER — MEDROXYPROGESTERONE ACETATE 150 MG/ML IM SUSP
150.0000 mg | Freq: Once | INTRAMUSCULAR | Status: AC
  Administered 2021-06-10: 150 mg via INTRAMUSCULAR

## 2021-06-10 NOTE — Progress Notes (Signed)
? ?  NURSE VISIT- INJECTION ? ?SUBJECTIVE:  ?Sherry Garrett is a 46 y.o. [phone] female here for a Depo Provera for contraception/period management. She is a GYN patient.  ? ?OBJECTIVE:  ?There were no vitals taken for this visit.  ?Appears well, in no apparent distress ? ?Injection administered in: Left deltoid ? ?Meds ordered this encounter  ?Medications  ? medroxyPROGESTERone (DEPO-PROVERA) injection 150 mg  ? ? ?ASSESSMENT: ?GYN patient Depo Provera for contraception/period management ?PLAN: ?Follow-up: in 11-13 weeks for next Depo  ? ?Annamarie Dawley  ?06/10/2021 ?4:21 PM  ?

## 2021-06-19 DIAGNOSIS — K080 Exfoliation of teeth due to systemic causes: Secondary | ICD-10-CM | POA: Diagnosis not present

## 2021-08-22 ENCOUNTER — Other Ambulatory Visit (HOSPITAL_COMMUNITY): Payer: Self-pay | Admitting: Adult Health

## 2021-08-22 DIAGNOSIS — Z1231 Encounter for screening mammogram for malignant neoplasm of breast: Secondary | ICD-10-CM

## 2021-09-02 ENCOUNTER — Ambulatory Visit (INDEPENDENT_AMBULATORY_CARE_PROVIDER_SITE_OTHER): Payer: Federal, State, Local not specified - PPO | Admitting: *Deleted

## 2021-09-02 DIAGNOSIS — Z3042 Encounter for surveillance of injectable contraceptive: Secondary | ICD-10-CM | POA: Diagnosis not present

## 2021-09-02 MED ORDER — MEDROXYPROGESTERONE ACETATE 150 MG/ML IM SUSP
150.0000 mg | Freq: Once | INTRAMUSCULAR | Status: AC
  Administered 2021-09-02: 150 mg via INTRAMUSCULAR

## 2021-09-02 NOTE — Progress Notes (Signed)
   NURSE VISIT- INJECTION  SUBJECTIVE:  Sherry Garrett is a 46 y.o. [phone] female here for a Depo Provera for contraception/period management. She is a GYN patient.   OBJECTIVE:  There were no vitals taken for this visit.  Appears well, in no apparent distress  Injection administered in: Right deltoid  Meds ordered this encounter  Medications   medroxyPROGESTERone (DEPO-PROVERA) injection 150 mg    ASSESSMENT: GYN patient Depo Provera for contraception/period management PLAN: Follow-up: in 11-13 weeks for next Depo   Jobe Marker  09/02/2021 4:08 PM

## 2021-10-07 ENCOUNTER — Ambulatory Visit (HOSPITAL_COMMUNITY)
Admission: RE | Admit: 2021-10-07 | Discharge: 2021-10-07 | Disposition: A | Discharge status: Home / Self Care | Payer: Federal, State, Local not specified - PPO | Source: Ambulatory Visit | Attending: Adult Health | Admitting: Adult Health

## 2021-10-07 DIAGNOSIS — Z1231 Encounter for screening mammogram for malignant neoplasm of breast: Secondary | ICD-10-CM | POA: Diagnosis not present

## 2021-10-08 ENCOUNTER — Other Ambulatory Visit: Payer: Self-pay | Admitting: Adult Health

## 2021-11-25 ENCOUNTER — Ambulatory Visit (INDEPENDENT_AMBULATORY_CARE_PROVIDER_SITE_OTHER): Payer: Federal, State, Local not specified - PPO | Admitting: *Deleted

## 2021-11-25 DIAGNOSIS — Z3042 Encounter for surveillance of injectable contraceptive: Secondary | ICD-10-CM | POA: Diagnosis not present

## 2021-11-25 MED ORDER — MEDROXYPROGESTERONE ACETATE 150 MG/ML IM SUSP
150.0000 mg | Freq: Once | INTRAMUSCULAR | Status: AC
  Administered 2021-11-25: 150 mg via INTRAMUSCULAR

## 2021-11-25 NOTE — Progress Notes (Signed)
   NURSE VISIT- INJECTION  SUBJECTIVE:  Sherry Garrett is a 46 y.o. [phone] female here for a Depo Provera for contraception/period management. She is a GYN patient.   OBJECTIVE:  There were no vitals taken for this visit.  Appears well, in no apparent distress  Injection administered in: Left deltoid  Meds ordered this encounter  Medications   medroxyPROGESTERone (DEPO-PROVERA) injection 150 mg    ASSESSMENT: GYN patient Depo Provera for contraception/period management PLAN: Follow-up: in 11-13 weeks for next Depo   CRESSIDA MILFORD  11/25/2021 4:23 PM

## 2022-01-07 ENCOUNTER — Encounter: Payer: Self-pay | Admitting: Adult Health

## 2022-01-07 ENCOUNTER — Ambulatory Visit (INDEPENDENT_AMBULATORY_CARE_PROVIDER_SITE_OTHER): Payer: Federal, State, Local not specified - PPO | Admitting: Adult Health

## 2022-01-07 VITALS — BP 130/84 | HR 97 | Ht 62.75 in | Wt 175.0 lb

## 2022-01-07 DIAGNOSIS — N939 Abnormal uterine and vaginal bleeding, unspecified: Secondary | ICD-10-CM | POA: Insufficient documentation

## 2022-01-07 DIAGNOSIS — F419 Anxiety disorder, unspecified: Secondary | ICD-10-CM | POA: Diagnosis not present

## 2022-01-07 DIAGNOSIS — R5383 Other fatigue: Secondary | ICD-10-CM | POA: Insufficient documentation

## 2022-01-07 DIAGNOSIS — R0683 Snoring: Secondary | ICD-10-CM | POA: Insufficient documentation

## 2022-01-07 DIAGNOSIS — Z3042 Encounter for surveillance of injectable contraceptive: Secondary | ICD-10-CM

## 2022-01-07 DIAGNOSIS — E7800 Pure hypercholesterolemia, unspecified: Secondary | ICD-10-CM | POA: Insufficient documentation

## 2022-01-07 DIAGNOSIS — I10 Essential (primary) hypertension: Secondary | ICD-10-CM | POA: Diagnosis not present

## 2022-01-07 DIAGNOSIS — Z1212 Encounter for screening for malignant neoplasm of rectum: Secondary | ICD-10-CM

## 2022-01-07 DIAGNOSIS — Z01419 Encounter for gynecological examination (general) (routine) without abnormal findings: Secondary | ICD-10-CM

## 2022-01-07 DIAGNOSIS — Z1211 Encounter for screening for malignant neoplasm of colon: Secondary | ICD-10-CM

## 2022-01-07 LAB — HEMOCCULT GUIAC POC 1CARD (OFFICE): Fecal Occult Blood, POC: NEGATIVE

## 2022-01-07 MED ORDER — MEDROXYPROGESTERONE ACETATE 150 MG/ML IM SUSY: PREFILLED_SYRINGE | INTRAMUSCULAR | 3 refills | Status: DC

## 2022-01-07 MED ORDER — FEXOFENADINE HCL 180 MG PO TABS: 180.0000 mg | ORAL_TABLET | Freq: Every day | ORAL | 99 refills | Status: AC

## 2022-01-07 MED ORDER — LOSARTAN POTASSIUM-HCTZ 50-12.5 MG PO TABS: 1.0000 | ORAL_TABLET | Freq: Every day | ORAL | 3 refills | Status: DC

## 2022-01-07 NOTE — Progress Notes (Signed)
Patient ID: Sherry Garrett, female   DOB: 10-07-1975, 46 y.o.   MRN: [id number] History of Present Illness: Sherry Garrett is a 46 year old white female,married, [phone] in for a well woman gyn exam. She is on depo and does not have a period,but  last month bleed for 7 days and had clots and cramping. She is tired and has been told she snores loudly.  Last pap was negative for HPV and malignancy  08/22/20.  PCP is Dr Adriana Simas   Current Medications, Allergies, Past Medical History, Past Surgical History, Family History and Social History were reviewed in [employer] electronic medical record.     Review of Systems: Patient denies any headaches, hearing loss, blurred vision, shortness of breath, chest pain, abdominal pain, problems with bowel movements, urination, or intercourse. No joint pain or mood swings.  See HPI for positives.   Physical Exam:BP 130/84 (BP Location: Left Arm, Patient Position: Sitting, Cuff Size: Normal)   Pulse 97   Ht 5' 2.75" (1.594 m)   Wt 175 lb (79.4 kg)   BMI 31.25 kg/m   General:  Well developed, well nourished, no acute distress Skin:  Warm and dry Neck:  Midline trachea, normal thyroid, good ROM, no lymphadenopathy Lungs; Clear to auscultation bilaterally Breast:  No dominant palpable mass, retraction, or nipple discharge Cardiovascular: Regular rate and rhythm Abdomen:  Soft, non tender, no hepatosplenomegaly Pelvic:  External genitalia is normal in appearance, no lesions.  The vagina is normal in appearance. Urethra has no lesions or masses. The cervix is bulbous.  Uterus is felt to be normal size, shape, and contour.  No adnexal masses or tenderness noted.Bladder is non tender, no masses felt. Rectal: Good sphincter tone, no polyps, or hemorrhoids felt.  Hemoccult negative. Extremities/musculoskeletal:  No swelling or varicosities noted, no clubbing or cyanosis Psych:  No mood changes, alert and cooperative,seems happy AA is 2 Fall risk is low     01/07/2022    1:38 PM 08/22/2020    9:52 AM 11/25/2018    8:41 AM  Depression screen PHQ 2/9  Decreased Interest 1 1 0  Down, Depressed, Hopeless 0 0 1  PHQ - 2 Score 1 1 1   Altered sleeping 1 0   Tired, decreased energy 3 2   Change in appetite 1 0   Feeling bad or failure about yourself  0 0   Trouble concentrating 1 0   Moving slowly or fidgety/restless 0 0   Suicidal thoughts 0 0   PHQ-9 Score 7 3    Denies depression    01/07/2022    1:39 PM 08/22/2020    9:52 AM  GAD 7 : Generalized Anxiety Score  Nervous, Anxious, on Edge 1 1  Control/stop worrying 1 1  Worry too much - different things 1 1  Trouble relaxing 1 1  Restless 1 0  Easily annoyed or irritable 1 1  Afraid - awful might happen 1 2  Total GAD 7 Score 7 7  Declines meds for anxiety, does deep breathing   Upstream - 01/07/22 1338       Pregnancy Intention Screening   Does the patient want to become pregnant in the next year? No    Does the patient's partner want to become pregnant in the next year? No    Would the patient like to discuss contraceptive options today? No      Contraception Wrap Up   Current Method Hormonal Injection    End Method Hormonal Injection  Examination chaperoned by Levy Pupa LPN   Impression and Plan: 1. Encounter for well woman exam with routine gynecological exam Physical in 1 year Pap in 2025 Will check labs fasting,orders given  - CBC - Comprehensive metabolic panel - Lipid panel  2. Encounter for screening fecal occult blood testing Hemoccult was negative - POCT occult blood stool  3. Screening for colorectal cancer No problems with bowels and no family history of colon cancer will check cologuard, she is aware if positive will need colonoscopy  - Cologuard  4. Essential hypertension Will refill Hyzaar - Comprehensive metabolic panel  5. Elevated cholesterol - Lipid panel  6. Abnormal uterine bleeding (AUB) Had bleeding for 7 days with  clots and cramps, no period in about 7 years on depo Will get pelvic US [facility]/20/22 at [facility] at 3:30 pm to assess uterus  - US PELVIC COMPLETE WITH TRANSVAGINAL; Future  7. Encounter for surveillance of injectable contraceptive Will refill depo   Meds ordered this encounter  Medications   medroxyPROGESTERone Acetate 150 MG/ML [name]    Sig: For IM injection every 12 weeks in office    Dispense:  1 mL    Refill:  3    Order Specific Question:   Supervising Provider    Answer:   Florian Buff [[id number]]   losartan-hydrochlorothiazide (HYZAAR) 50-12.5 MG tablet    Sig: Take 1 tablet by mouth daily.    Dispense:  90 tablet    Refill:  3    Order Specific Question:   Supervising Provider    Answer:   Tania Ade H [[id number]]   fexofenadine (ALLEGRA) 180 MG tablet    Sig: Take 1 tablet (180 mg total) by mouth daily.    Dispense:  30 tablet    Refill:  PRN    Order Specific Question:   Supervising Provider    Answer:   Elonda Husky, LUTHER H [[id number]]    8. Snores +loud snoring Referred to [facility] for sleep study  - Ambulatory referral to Neurology  9. Tired Referred for sleep study to [place] - Ambulatory referral to Neurology  10. Anxiety She declines meds Does deep breathing May seek counseling

## 2022-01-08 DIAGNOSIS — E7800 Pure hypercholesterolemia, unspecified: Secondary | ICD-10-CM | POA: Diagnosis not present

## 2022-01-08 DIAGNOSIS — I10 Essential (primary) hypertension: Secondary | ICD-10-CM | POA: Diagnosis not present

## 2022-01-08 DIAGNOSIS — Z01419 Encounter for gynecological examination (general) (routine) without abnormal findings: Secondary | ICD-10-CM | POA: Diagnosis not present

## 2022-01-09 LAB — LIPID PANEL
Chol/HDL Ratio: 6.3 ratio — ABNORMAL HIGH (ref 0.0–4.4)
Cholesterol, Total: 184 mg/dL (ref 100–199)
HDL: 29 mg/dL — ABNORMAL LOW (ref >39)
LDL Chol Calc (NIH): 122 mg/dL — ABNORMAL HIGH (ref 0–99)
Triglycerides: 187 mg/dL — ABNORMAL HIGH (ref 0–149)
VLDL Cholesterol Cal: 33 mg/dL (ref 5–40)

## 2022-01-09 LAB — CBC
Hematocrit: 44.2 % (ref 34.0–46.6)
Hemoglobin: 15.1 g/dL (ref 11.1–15.9)
MCH: 30.9 pg (ref 26.6–33.0)
MCHC: 34.2 g/dL (ref 31.5–35.7)
MCV: 90 fL (ref 79–97)
Platelets: 342 10*3/uL (ref 150–450)
RBC: 4.89 x10E6/uL (ref 3.77–5.28)
RDW: 12.5 % (ref 11.7–15.4)
WBC: 11.6 10*3/uL — ABNORMAL HIGH (ref 3.4–10.8)

## 2022-01-09 LAB — COMPREHENSIVE METABOLIC PANEL
ALT: 13 IU/L (ref 0–32)
AST: 16 IU/L (ref 0–40)
Albumin/Globulin Ratio: 2.1 (ref 1.2–2.2)
Albumin: 4.8 g/dL (ref 3.9–4.9)
Alkaline Phosphatase: 69 IU/L (ref 44–121)
BUN/Creatinine Ratio: 11 (ref 9–23)
BUN: 9 mg/dL (ref 6–24)
Bilirubin Total: 0.3 mg/dL (ref 0.0–1.2)
CO2: 21 mmol/L (ref 20–29)
Calcium: 9.7 mg/dL (ref 8.7–10.2)
Chloride: 102 mmol/L (ref 96–106)
Creatinine, Ser: 0.85 mg/dL (ref 0.57–1.00)
Globulin, Total: 2.3 g/dL (ref 1.5–4.5)
Glucose: 101 mg/dL — ABNORMAL HIGH (ref 70–99)
Potassium: 4.1 mmol/L (ref 3.5–5.2)
Sodium: 140 mmol/L (ref 134–144)
Total Protein: 7.1 g/dL (ref 6.0–8.5)
eGFR: 85 mL/min/{1.73_m2} (ref >59)

## 2022-01-10 ENCOUNTER — Other Ambulatory Visit: Payer: Self-pay | Admitting: Adult Health

## 2022-01-10 MED ORDER — ROSUVASTATIN CALCIUM 20 MG PO TABS: 20.0000 mg | ORAL_TABLET | Freq: Every day | ORAL | 3 refills | Status: AC

## 2022-01-20 ENCOUNTER — Ambulatory Visit (HOSPITAL_COMMUNITY)
Admission: RE | Admit: 2022-01-20 | Discharge: 2022-01-20 | Disposition: A | Discharge status: Home / Self Care | Payer: Federal, State, Local not specified - PPO | Source: Ambulatory Visit | Attending: Adult Health | Admitting: Adult Health

## 2022-01-20 DIAGNOSIS — N939 Abnormal uterine and vaginal bleeding, unspecified: Secondary | ICD-10-CM | POA: Diagnosis not present

## 2022-01-28 LAB — COLOGUARD: COLOGUARD: NEGATIVE

## 2022-02-13 ENCOUNTER — Ambulatory Visit: Payer: Federal, State, Local not specified - PPO | Admitting: Neurology

## 2022-02-13 ENCOUNTER — Encounter: Payer: Self-pay | Admitting: Neurology

## 2022-02-13 VITALS — BP 142/93 | HR 88 | Ht 62.0 in | Wt 175.2 lb

## 2022-02-13 DIAGNOSIS — R0681 Apnea, not elsewhere classified: Secondary | ICD-10-CM | POA: Diagnosis not present

## 2022-02-13 DIAGNOSIS — Z820 Family history of epilepsy and other diseases of the nervous system: Secondary | ICD-10-CM

## 2022-02-13 DIAGNOSIS — R0683 Snoring: Secondary | ICD-10-CM

## 2022-02-13 DIAGNOSIS — G4730 Sleep apnea, unspecified: Secondary | ICD-10-CM | POA: Diagnosis not present

## 2022-02-13 DIAGNOSIS — R030 Elevated blood-pressure reading, without diagnosis of hypertension: Secondary | ICD-10-CM

## 2022-02-13 DIAGNOSIS — G4719 Other hypersomnia: Secondary | ICD-10-CM

## 2022-02-13 DIAGNOSIS — E669 Obesity, unspecified: Secondary | ICD-10-CM

## 2022-02-13 DIAGNOSIS — R519 Headache, unspecified: Secondary | ICD-10-CM

## 2022-02-13 NOTE — Patient Instructions (Signed)

## 2022-02-13 NOTE — Progress Notes (Signed)
Subjective:    Patient ID: Sherry Garrett is a 46 y.o. female.  HPI    Huston FoleySaima Clovis Mankins, MD, PhD [facility] [address], [address] [address] [place], [place] [address]  Dear Victorino DikeJennifer,  I saw your patient, Sherry Garrett, upon your kind request in my sleep clinic today for initial consultation of her sleep disorder, in particular, concern for underlying obstructive sleep apnea.  The patient is unaccompanied today.  As you know, Sherry Garrett is a 46 year old female with an underlying medical history of hypertension, hyperlipidemia, smoking, abnormal uterine bleeding, anxiety and mild obesity, who reports snoring and excessive daytime somnolence.  I reviewed your office note from 01/07/2022.  Her Epworth sleepiness score is 11 out of 24, fatigue severity score is 48 out of 63.  She has had witnessed apneas and has also woken herself up with a sense of gasping for air.  Her mom and maternal grandmother have sleep apnea, both have PAP machines.  Patient works for the [employer], [employer] as Training and development officermanager of operations.  She lives with her family which includes her husband and 2 younger children, ages 7314 and 577, she has a 47 year old as well.  She smokes about a pack per day, she has worked on smoking cessation but has not been fully successful.  She drinks quite a bit of caffeine in the form of coffee, 4 large cups per day in the mornings, and 1 soda or cold coffee later on.  She has had rare morning headaches, denies night to night nocturia.  She is a restless sleeper and does not wake up rested.  She has 1 dog and 1 cat in the household, she does not share a bedroom with her husband currently as her snoring is too loud for him and he is a light sleeper.  The dog sleeps on her bed typically.  Her Past Medical History Is Significant For: Past Medical History:  Diagnosis Date   Benign essential hypertension 2019   Genital warts    Hematuria    HPV (human papilloma virus) infection     Pre-eclampsia     Her Past Surgical History Is Significant For: Past Surgical History:  Procedure Laterality Date   APPENDECTOMY     LASER ABLATION OF THE CERVIX  2005    Her Family History Is Significant For: Family History  Problem Relation Age of Onset   Sleep apnea Mother    Diabetes Father    Heart attack Father    Hypertension Father    Cancer Father [id number]       cancer in spinal fluid   Diabetes Brother    Stroke Maternal Grandmother        mini strokes   Sleep apnea Maternal Grandmother    Cancer Maternal Grandfather        prostate   Diabetes Paternal Grandmother    Heart attack Paternal Grandmother    Cancer Paternal Grandfather    Cancer Other    Hypertension Other     Her Social History Is Significant For: Social History   Socioeconomic History   Marital status: Married    Spouse name: Not on file   Number of children: 3   Years of education: Not on file   Highest education level: Not on file  Occupational History   Not on file  Tobacco Use   Smoking status: Every Day    Packs/day: 0.50    Years: 17.00    Total pack years: 8.50  Types: Cigarettes   Smokeless tobacco: Never  Vaping Use   Vaping Use: Never used  Substance and Sexual Activity   Alcohol use: Not Currently    Alcohol/week: 1.0 standard drink of alcohol    Types: 1 Standard drinks or equivalent per week    Comment: occ   Drug use: No   Sexual activity: Yes    Birth control/protection: Injection  Other Topics Concern   Not on file  Social History Narrative   Lives with Husband   Right Handed   1/2 pot of Coffee in the Morning   1 can of Cold Coffee   Social Determinants of Health   Financial Resource Strain: Low Risk  (01/07/2022)   Overall Financial Resource Strain (CARDIA)    Difficulty of Paying Living Expenses: Not very hard  Food Insecurity: No Food Insecurity (01/07/2022)   Hunger Vital Sign    Worried About Running Out of Food in the Last Year: Never true    Ran  Out of Food in the Last Year: Never true  Transportation Needs: No Transportation Needs (01/07/2022)   PRAPARE - Administrator, Civil Service (Medical): No    Lack of Transportation (Non-Medical): No  Physical Activity: Insufficiently Active (01/07/2022)   Exercise Vital Sign    Days of Exercise per Week: 1 day    Minutes of Exercise per Session: 20 min  Stress: Stress Concern Present (01/07/2022)   [employer] of Occupational Health - Occupational Stress Questionnaire    Feeling of Stress : To some extent  Social Connections: Moderately Integrated (01/07/2022)   Social Connection and Isolation Panel [NHANES]    Frequency of Communication with Friends and Family: More than three times a week    Frequency of Social Gatherings with Friends and Family: Once a week    Attends Religious Services: 1 to 4 times per year    Active Member of [employer] or Organizations: No    Attends Banker Meetings: Never    Marital Status: Married    Her Allergies Are:  No Known Allergies:   Her Current Medications Are:  Outpatient Encounter Medications as of 02/13/2022  Medication Sig   fexofenadine (ALLEGRA) 180 MG tablet Take 1 tablet (180 mg total) by mouth daily.   losartan-hydrochlorothiazide (HYZAAR) 50-12.5 MG tablet Take 1 tablet by mouth daily.   medroxyPROGESTERone Acetate 150 MG/ML [name] For IM injection every 12 weeks in office   Triamcinolone Acetonide (NASACORT ALLERGY 24HR NA) Place into the nose as needed.   rosuvastatin (CRESTOR) 20 MG tablet Take 1 tablet (20 mg total) by mouth daily. (Patient not taking: Reported on 02/13/2022)   No facility-administered encounter medications on file as of 02/13/2022.  :   Review of Systems:  Out of a complete 14 point review of systems, all are reviewed and negative with the exception of these symptoms as listed below:  Review of Systems  Neurological:        Pt is here for Sleep Consult. Pt states that she is tired  all of the time. Both Mother and Grandmother have Sleep Apnea. Pt states that she does snore. Pt states that she has headaches.    ESS:11 FSS:48    Objective:  Neurological Exam  Physical Exam Physical Examination:   Vitals:   02/13/22 1224  BP: (!) 142/93  Pulse: 88    General Examination: The patient is a very pleasant 46 y.o. female in no acute distress. She appears well-developed and well-nourished and well  groomed.   HEENT: Normocephalic, atraumatic, pupils are equal, round and reactive to light, extraocular tracking is good without limitation to gaze excursion or nystagmus noted. Hearing is grossly intact. Face is symmetric with normal facial animation. Speech is clear with no dysarthria noted. There is no hypophonia. There is no lip, neck/head, jaw or voice tremor. Neck is supple with full range of passive and active motion. There are no carotid bruits on auscultation. Oropharynx exam reveals: No significant mouth dryness, good dental hygiene, moderate airway crowding secondary to small airway entry, redundant soft palate, tonsillar size of about 1+ bilaterally, wider tongue.  Tongue protrudes centrally and palate elevates symmetrically, neck circumference of 14 three-quarter inches, mild overbite noted.   Chest: Clear to auscultation without wheezing, rhonchi or crackles noted.  Heart: S1+S2+0, regular and normal without murmurs, rubs or gallops noted.   Abdomen: Soft, non-tender and non-distended.  Extremities: There is no obvious edema in the distal lower extremities bilaterally.   Skin: Warm and dry without trophic changes noted.   Musculoskeletal: exam reveals no obvious joint deformities.   Neurologically:  Mental status: The patient is awake, alert and oriented in all 4 spheres. Her immediate and remote memory, attention, language skills and fund of knowledge are appropriate. There is no evidence of aphasia, agnosia, apraxia or anomia. Speech is clear with normal  prosody and enunciation. Thought process is linear. Mood is normal and affect is normal.  Cranial nerves II - XII are as described above under HEENT exam.  Motor exam: Normal bulk, strength and tone is noted. There is no obvious action or resting tremor.  Fine motor skills and coordination: grossly intact.  Cerebellar testing: No dysmetria or intention tremor. There is no truncal or gait ataxia.  Sensory exam: intact to light touch in the upper and lower extremities.  Gait, station and balance: She stands easily. No veering to one side is noted. No leaning to one side is noted. Posture is age-appropriate and stance is narrow based. Gait shows normal stride length and normal pace. No problems turning are noted.   Assessment and Plan:  In summary, Sherry Garrett is a very pleasant 46 y.o.-year old female with an underlying medical history of hypertension, hyperlipidemia, abnormal uterine bleeding, anxiety and mild obesity, whose history and physical exam are concerning for sleep disordered breathing, supporting a current working diagnosis of unspecified sleep apnea, with the main differential diagnoses of obstructive sleep apnea (OSA) versus upper airway resistance syndrome (UARS) versus central sleep apnea (CSA), or mixed sleep apnea. A laboratory attended sleep study is typically considered "gold standard" for evaluation of sleep disordered breathing.   I had a long chat with the patient about my findings and the diagnosis of sleep apnea, particularly OSA, its prognosis and treatment options. We talked about medical/conservative treatments, surgical interventions and non-pharmacological approaches for symptom control. I explained, in particular, the risks and ramifications of untreated moderate to severe OSA, especially with respect to developing cardiovascular disease down the road, including congestive heart failure (CHF), difficult to treat hypertension, cardiac arrhythmias (particularly A-fib),  neurovascular complications including TIA, stroke and dementia. Even type 2 diabetes has, in part, been linked to untreated OSA. Symptoms of untreated OSA may include (but may not be limited to) daytime sleepiness, nocturia (i.e. frequent nighttime urination), memory problems, mood irritability and suboptimally controlled or worsening mood disorder such as depression and/or anxiety, lack of energy, lack of motivation, physical discomfort, as well as recurrent headaches, especially morning or nocturnal headaches. We  talked about the importance of maintaining a healthy lifestyle and striving for healthy weight.  The importance of complete smoking cessation was also addressed.  I recommended a sleep study at this time. I outlined the differences between a laboratory attended sleep study which is considered more comprehensive and accurate over the option of a home sleep test (HST); the latter may lead to underestimation of sleep disordered breathing in some instances and does not help with diagnosing upper airway resistance syndrome and is not accurate enough to diagnose primary central sleep apnea typically. I outlined possible surgical and non-surgical treatment options of OSA, including the use of a positive airway pressure (PAP) device (i.e. CPAP, AutoPAP/APAP or BiPAP in certain circumstances), a custom-made dental device (aka oral appliance, which would require a referral to a specialist dentist or orthodontist typically, and is generally speaking not considered for patients with full dentures or edentulous state), upper airway surgical options, such as traditional UPPP (which is not considered a first-line treatment) or the Inspire device (hypoglossal nerve stimulator, which would involve a referral for consultation with an ENT surgeon, after careful selection, following inclusion criteria - also not first-line treatment). I explained the PAP treatment option to the patient in detail, as this is generally  considered first-line treatment.  The patient indicated that she would be willing to try PAP therapy, if the need arises. I explained the importance of being compliant with PAP treatment, not only for insurance purposes but primarily to improve patient's symptoms symptoms, and for the patient's long term health benefit, including to reduce Her cardiovascular risks longer-term.    We will pick up our discussion about the next steps and treatment options after testing.  We will keep her posted as to the test results by phone call and/or MyChart messaging where possible.  We will plan to follow-up in sleep clinic accordingly as well.  I answered all her questions today and the patient was in agreement.   I encouraged her to call with any interim questions, concerns, problems or updates or email [place] through MyChart.  Generally speaking, sleep test authorizations may take up to 2 weeks, sometimes less, sometimes longer, the patient is encouraged to get in touch with [place] if they do not hear back from the sleep lab staff directly within the next 2 weeks.  Thank you very much for allowing me to participate in the care of this nice patient. If I can be of any further assistance to you please do not hesitate to call me at [phone].  Sincerely,   Huston Foley, MD, PhD

## 2022-02-17 ENCOUNTER — Other Ambulatory Visit: Payer: Self-pay | Admitting: Adult Health

## 2022-02-17 ENCOUNTER — Ambulatory Visit (INDEPENDENT_AMBULATORY_CARE_PROVIDER_SITE_OTHER): Payer: Federal, State, Local not specified - PPO | Admitting: *Deleted

## 2022-02-17 DIAGNOSIS — Z3042 Encounter for surveillance of injectable contraceptive: Secondary | ICD-10-CM | POA: Diagnosis not present

## 2022-02-17 MED ORDER — MEDROXYPROGESTERONE ACETATE 150 MG/ML IM SUSP
150.0000 mg | Freq: Once | INTRAMUSCULAR | Status: AC
  Administered 2022-02-17: 150 mg via INTRAMUSCULAR

## 2022-02-17 NOTE — Progress Notes (Signed)
   NURSE VISIT- INJECTION  SUBJECTIVE:  Sherry Garrett is a 46 y.o. [phone] female here for a Depo Provera for contraception/period management. She is a GYN patient.   OBJECTIVE:  There were no vitals taken for this visit.  Appears well, in no apparent distress  Injection administered in: Left deltoid  Meds ordered this encounter  Medications   medroxyPROGESTERone (DEPO-PROVERA) injection 150 mg    ASSESSMENT: GYN patient Depo Provera for contraception/period management PLAN: Follow-up: in 11-13 weeks for next Depo   Sherry Garrett  02/17/2022 4:16 PM

## 2022-02-17 NOTE — Telephone Encounter (Signed)
Patient a refill sent to the pharmacy for her Depo. She has an appointment this afternoon.

## 2022-02-25 ENCOUNTER — Telehealth: Payer: Self-pay | Admitting: Neurology

## 2022-02-25 NOTE — Telephone Encounter (Signed)
[employer] pending faxed notes- once they receive the clinial's it can take up to 15 business days.

## 2022-03-24 NOTE — Telephone Encounter (Signed)
[employer] fed approved the [place]PSG but will expire before she has her appointment.  The Josem Kaufmann is [id number] (exp. 12/21/3 to 04/04/22)  I will redo an auth.  The patient is scheduled at [facility] for 04/07/22 at 9 pm.  Mailed packet and sent [employer].

## 2022-03-24 NOTE — Telephone Encounter (Signed)
I called [employer] fed and spoke with Indie D and inforemd her the auth number I have will expire before she has her SS and asked if I could get an extension on the auth number..  She sent a message to the nurse and it is pending review to see if the nurse will give the extension for her schedule SS for 04/07/22.

## 2022-03-25 NOTE — Telephone Encounter (Signed)
Updated Environmental consultant.  NPSG- [employer] fed Josem Kaufmann: [id number] (exp. 03/06/22 to 04/15/22)   Patient is scheduled at [facility] for 04/07/22 at 9 pm.

## 2022-04-07 ENCOUNTER — Ambulatory Visit (INDEPENDENT_AMBULATORY_CARE_PROVIDER_SITE_OTHER): Payer: Federal, State, Local not specified - PPO | Admitting: Neurology

## 2022-04-07 DIAGNOSIS — R0681 Apnea, not elsewhere classified: Secondary | ICD-10-CM

## 2022-04-07 DIAGNOSIS — G4719 Other hypersomnia: Secondary | ICD-10-CM

## 2022-04-07 DIAGNOSIS — R0683 Snoring: Secondary | ICD-10-CM

## 2022-04-07 DIAGNOSIS — R519 Headache, unspecified: Secondary | ICD-10-CM

## 2022-04-07 DIAGNOSIS — G4720 Circadian rhythm sleep disorder, unspecified type: Secondary | ICD-10-CM

## 2022-04-07 DIAGNOSIS — G4730 Sleep apnea, unspecified: Secondary | ICD-10-CM

## 2022-04-07 DIAGNOSIS — G4733 Obstructive sleep apnea (adult) (pediatric): Secondary | ICD-10-CM | POA: Diagnosis not present

## 2022-04-07 DIAGNOSIS — E669 Obesity, unspecified: Secondary | ICD-10-CM

## 2022-04-07 DIAGNOSIS — Z820 Family history of epilepsy and other diseases of the nervous system: Secondary | ICD-10-CM

## 2022-04-07 DIAGNOSIS — R030 Elevated blood-pressure reading, without diagnosis of hypertension: Secondary | ICD-10-CM

## 2022-04-10 NOTE — Addendum Note (Signed)
Addended by: Star Age on: 04/10/2022 06:03 PM   Modules accepted: Orders

## 2022-04-10 NOTE — Procedures (Signed)
Physician Interpretation:     [facility] POLYSOMNOGRAPHY  INTERPRETATION REPORT   STUDY DATE:  04/07/2022     PATIENT NAME:  Sherry Garrett         DATE OF BIRTH:  Aug 28, 1975  PATIENT ID:  [id number]    TYPE OF STUDY:  PSG  READING PHYSICIAN: Star Age, MD, PhD   SCORING TECHNICIAN: Gaylyn Cheers, RPSGT  Referred by: Derrek Monaco, NP  History and Indication for Testing: 47 year old female with an underlying medical history of hypertension, hyperlipidemia, smoking,?abnormal uterine bleeding, anxiety and mild obesity, who reports snoring and excessive daytime somnolence, as well as witnessed apneas and a family history of sleep apnea. ?Her Epworth sleepiness score is 11 out of 24, fatigue severity score is 48 out of 63. Height: 62 in Weight: 175 lb (BMI 32) Neck Size: 15 in.   MEDICATIONS: Allegra, Hyzaar, Medroxyprogesterone Acetate, Nasacort Allergy, Crestor  TECHNICAL DESCRIPTION: A registered sleep technologist was in attendance for the duration of the recording.  Data collection, scoring, video monitoring, and reporting were performed in compliance with the AASM Manual for the Scoring of Sleep and Associated Events; (Hypopnea is scored based on the criteria listed in Section VIII D. 1b in the AASM Manual V2.6 using a 4% oxygen desaturation rule or Hypopnea is scored based on the criteria listed in Section VIII D. 1a in the AASM Manual V2.6 using 3% oxygen desaturation and /or arousal rule).   SLEEP CONTINUITY AND SLEEP ARCHITECTURE:  Lights-out was at 21:54: and lights-on at  04:54:, with a total recording time of 7 hours, 0.5 min. Total sleep time ( TST) was 331.5 minutes with a decreased sleep efficiency at 78.8%.   BODY POSITION:  TST was divided  between the following sleep positions: 11.0% supine;  24.1% lateral;  65% prone. Duration of total sleep and percent of total sleep in their respective position is as follows: supine 36 minutes (11%),  non-supine 295 minutes (89%); right 58 minutes (18%), left 21 minutes (6%), and prone 215 minutes (65%).  Total supine REM sleep time was 00 minutes (0% of total REM sleep). Sleep latency was normal at 18.5 minutes.  REM sleep latency was increased at 172.0 minutes. Of the total sleep time, the percentage of stage N1 sleep was 20.7%, which is markedly increased, stage N2 sleep was 52%, stage N3 sleep was 15.2%, and REM sleep was 11.6%, which is reduced. Wake after sleep onset (WASO) time accounted for 70.5 minutes with moderate sleep fragmentation noted.   RESPIRATORY MONITORING:  Based on CMS criteria (using a 4% oxygen desaturation rule for scoring hypopneas), there were 37 apneas (37 obstructive; 0 central; 0 mixed), and 2 hypopneas.  Apnea index was 6.7. Hypopnea index was 0.4. The apnea-hypopnea index was 7.1 overall (55.9 supine, 5 non-supine; 4.7 REM, 0.0 supine REM). There were 0 respiratory effort-related arousals (RERAs).  The RERA index was 0 events/h. Total respiratory disturbance index (RDI) was 7.1 events/h. RDI results showed: supine RDI  55.9 /h; non-supine RDI 1.0 /h; REM RDI 4.7 /h, supine REM RDI 0.0 /h.   Based on AASM criteria (using a 3% oxygen desaturation and /or arousal rule for scoring hypopneas), there were 37 apneas (37 obstructive; 0 central; 0 mixed), and 10 hypopneas. Apnea index was 6.7. Hypopnea index was 1.8. The apnea-hypopnea index was 8.5/hour overall (57.5/hour, supine, 6 non-supine; 6.2/hour REM, 0.0 supine REM). There were 0 respiratory effort-related arousals (RERAs).  The RERA index was 0 events/h. Total respiratory disturbance  index (RDI) was 8.5 events/h. RDI results showed: supine RDI  57.5 /h; non-supine RDI 2.4 /h; REM RDI 6.2 /h, supine REM RDI 0.0 /h.   OXIMETRY: Oxyhemoglobin Saturation Nadir during sleep was at  87%) from a mean of 96%.  Of the Total sleep time (TST)   hypoxemia (=<88%) was present for  22.8 minutes, or 6.9% of total sleep time.   LIMB  MOVEMENTS: There were 21 periodic limb movements of sleep (3.8/hr), of which 9 (1.6/hr) were associated with an arousal.  AROUSAL: There were 84 arousals in total, for an arousal index of 15 arousals/hour.  Of these, 29 were identified as respiratory-related arousals (5 /h), 9 were PLM-related arousals (2 /h), and 66 were non-specific arousals (12 /h).  EEG: Review of the EEG showed no abnormal electrical discharges and symmetrical bihemispheric findings.    EKG: The EKG revealed normal sinus rhythm (NSR). The average heart rate during sleep was 74 bpm.   AUDIO/VIDEO REVIEW: The audio and video review did not show any abnormal or unusual behaviors, movements, phonations or vocalizations. The patient took 1 restroom break.  Snoring was detected, ranging from mild to loud at times.  POST-STUDY QUESTIONNAIRE: Post study, the patient indicated, that sleep was worse than usual.   IMPRESSION:  1. Mild obstructive Sleep Apnea (OSA) 2. Dysfunctions associated with sleep stages or arousal from sleep  RECOMMENDATIONS:  1. This study demonstrates overall mild obstructive sleep apnea, more pronounced during supine sleep with a total AHI of 8.5/h, REM AHI of 6.2/h, and supine AHI of 57.5/h, O2 nadir 87%. Given the patient's medical history and sleep related complaints, treatment with positive airway pressure is recommended; this can be achieved in the form of autoPAP. Alternatively, a full-night CPAP titration study would allow optimization of therapy if needed. Other treatment options may include avoidance of supine sleep position along with weight loss, or the use of an oral appliance in selected patients. Please note, that untreated obstructive sleep apnea may carry additional perioperative morbidity. Patients with significant obstructive sleep apnea should receive perioperative PAP therapy and the surgeons and particularly the anesthesiologist should be informed of the diagnosis and the severity of the sleep  disordered breathing. 2. This study shows sleep fragmentation and abnormal sleep stage percentages; these are nonspecific findings and per se do not signify an intrinsic sleep disorder or a cause for the patient's sleep-related symptoms. Causes include (but are not limited to) the first night effect of the sleep study, circadian rhythm disturbances, medication effect or an underlying mood disorder or medical problem.  3. The patient should be cautioned not to drive, work at heights, or operate dangerous or heavy equipment when tired or sleepy. Review and reiteration of good sleep hygiene measures should be pursued with any patient. 4. The patient will be seen in follow-up by Dr. Frances Furbish at [facility] for discussion of the test results and further management strategies. The referring provider will be notified of the test results. I certify that I have reviewed the entire raw data recording prior to the issuance of this report in accordance with the Standards of Accreditation of the American Academy of Sleep Medicine (AASM).  Huston Foley, MD, PhD Medical Director, [facility]) Diplomat, ABPN (Neurology and Sleep)                Technical Report:   General Information  Name: Sherry Garrett, Sherry Garrett BMI: 32.01 Physician: Huston Foley, MD  ID: [id number] Height: 62.0 in Technician: Domingo Cocking,  RPSGT  Sex: Female Weight: 175.0 lb Record: [id number]  Age: 27 [05-Jul-1975] Date: 04/07/2022    Medical & Medication History    Ms. Zemanek is a 48 year old female with an underlying medical history of hypertension, hyperlipidemia, smoking, abnormal uterine bleeding, anxiety and mild obesity, who reports snoring and excessive daytime somnolence. Her Epworth sleepiness score is 11 out of 24, fatigue severity score is 48 out of 63. She has had witnessed apneas and has also woken herself up with a sense of gasping for air. Her mom and maternal grandmother have sleep apnea, both  have PAP machines. 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  Allegra, Hyzaar, Medroxyprogesterone Acetate, Nasacort Allergy, Crestor   Sleep Disorder      Comments   Patient arrived for a diagnostic polysomnogram. Procedure explained and all questions answered. Standard paste setup without complications. Patient slept supine, right, left, and prone. Mild to loud snoring was heard. Occasional respiratory events observed. No obvious cardiac arrhythmias noted. PLMS observed. Patient had one restroom visit.    Lights out: 09:54:03 PM Lights on: 04:54:18 AM   Time Total Supine Side Prone Upright  Recording (TRT) 7h 0.67m 1h 27.48m 1h 33.24m 4h 0.27m 0h 0.43m  Sleep (TST) 5h 31.60m 0h 36.68m 1h 20.27m 3h 35.82m 0h 0.59m   Latency N1 N2 N3 REM Onset Per. Slp. Eff.  Actual 0h 0.75m 0h 19.36m 0h 58.61m 2h 52.23m 0h 18.48m 0h 36.93m 78.83%   Stg Dur Wake N1 N2 N3 REM  Total 89.0 68.5 174.0 50.5 38.5  Supine 50.5 20.5 16.0 0.0 0.0  Side 13.0 30.5 31.5 6.5 11.5  Prone 25.5 17.5 126.5 44.0 27.0  Upright 0.0 0.0 0.0 0.0 0.0   Stg % Wake N1 N2 N3 REM  Total 21.2 20.7 52.5 15.2 11.6  Supine 12.0 6.2 4.8 0.0 0.0  Side 3.1 9.2 9.5 2.0 3.5  Prone 6.1 5.3 38.2 13.3 8.1  Upright 0.0 0.0 0.0 0.0 0.0     Apnea Summary Sub Supine Side Prone Upright  Total 37 Total 37 34 1 2 0    REM 2 0 1 1 0    NREM 35 34 0 1 0  Obs 37 REM 2 0 1 1 0    NREM 35 34 0 1 0  Mix 0 REM 0 0 0 0 0    NREM 0 0 0 0 0  Cen 0 REM 0 0 0 0 0    NREM 0 0 0 0 0   Rera Summary Sub Supine Side Prone Upright  Total 0 Total 0 0 0 0 0    REM 0 0 0 0 0    NREM 0 0 0 0 0   Hypopnea Summary Sub Supine Side Prone Upright  Total 10 Total 10 1 9  0 0    REM 2 0 2 0 0    NREM 8 1 7  0 0   4% Hypopnea Summary Sub Supine Side Prone Upright  Total (4%) 2 Total 2 0 2 0 0    REM 1 0 1 0 0    NREM 1 0 1 0 0     AHI Total Obs Mix Cen  8.51 Apnea 6.70 6.70 0.00 0.00   Hypopnea 1.81 -- -- --  7.06 Hypopnea (4%) 0.36 -- -- --     Total Supine Side Prone Upright  Position AHI 8.51 57.53 7.50 0.56 0.00  REM AHI 6.23   NREM AHI 8.81   Position RDI  8.51 57.53 7.50 0.56 0.00  REM RDI 6.23   NREM RDI 8.81    4% Hypopnea Total Supine Side Prone Upright  Position AHI (4%) 7.06 55.89 2.25 0.56 0.00  REM AHI (4%) 4.68   NREM AHI (4%) 7.37   Position RDI (4%) 7.06 55.89 2.25 0.56 0.00  REM RDI (4%) 4.68   NREM RDI (4%) 7.37    Desaturation Information Threshold: 2% <100% <90% <80% <70% <60% <50% <40%  Supine 56.0 1.0 0.0 0.0 0.0 0.0 0.0  Side 27.0 0.0 0.0 0.0 0.0 0.0 0.0  Prone 46.0 1.0 0.0 0.0 0.0 0.0 0.0  Upright 0.0 0.0 0.0 0.0 0.0 0.0 0.0  Total 129.0 2.0 0.0 0.0 0.0 0.0 0.0  Index 19.3 0.3 0.0 0.0 0.0 0.0 0.0   Threshold: 3% <100% <90% <80% <70% <60% <50% <40%  Supine 33.0 1.0 0.0 0.0 0.0 0.0 0.0  Side 7.0 0.0 0.0 0.0 0.0 0.0 0.0  Prone 17.0 1.0 0.0 0.0 0.0 0.0 0.0  Upright 0.0 0.0 0.0 0.0 0.0 0.0 0.0  Total 57.0 2.0 0.0 0.0 0.0 0.0 0.0  Index 8.5 0.3 0.0 0.0 0.0 0.0 0.0   Threshold: 4% <100% <90% <80% <70% <60% <50% <40%  Supine 16.0 1.0 0.0 0.0 0.0 0.0 0.0  Side 3.0 0.0 0.0 0.0 0.0 0.0 0.0  Prone 9.0 0.0 0.0 0.0 0.0 0.0 0.0  Upright 0.0 0.0 0.0 0.0 0.0 0.0 0.0  Total 28.0 1.0 0.0 0.0 0.0 0.0 0.0  Index 4.2 0.1 0.0 0.0 0.0 0.0 0.0   Threshold: 3% <100% <90% <80% <70% <60% <50% <40%  Supine 33 1 0 0 0 0 0  Side 7 0 0 0 0 0 0  Prone 17 1 0 0 0 0 0  Upright 0 0 0 0 0 0 0  Total 57 2 0 0 0 0 0   Awakening/Arousal Information # of Awakenings 43  Wake after sleep onset 70.67m  Wake after persistent sleep 57.32m   Arousal Assoc. Arousals Index  Apneas 24 4.3  Hypopneas 6 1.1  Leg Movements 19 3.4  Snore 0 0.0  PTT Arousals 0 0.0  Spontaneous 66 11.9  Total 115 20.8  Leg Movement Information PLMS LMs Index  Total LMs during PLMS 21 3.8  LMs w/ Microarousals 9 1.6   LM LMs Index  w/ Microarousal 10 1.8  w/ Awakening 7 1.3  w/ Resp Event 0 0.0  Spontaneous 14 2.5  Total 24 4.3      Desaturation threshold setting: 3% Minimum desaturation setting: 10 seconds SaO2 nadir: 87% The longest event was a 35 sec obstructive Hypopnea with a minimum SaO2 of 94%. The lowest SaO2 was 50% associated with a 18 sec obstructive Hypopnea. EKG Rates EKG Avg Max Min  Awake 81 93 68  Asleep 74 87 63  EKG Events: Tachycardia

## 2022-04-17 ENCOUNTER — Telehealth: Payer: Self-pay

## 2022-04-17 NOTE — Telephone Encounter (Signed)
-----  Message from Kary Kos, CMA sent at 04/17/2022  8:16 AM EST -----  ----- Message ----- From: Star Age, MD Sent: 04/10/2022   6:03 PM EST To: Gna-Pod 4 Results  Patient referred by PCP, seen by me on 02/13/22, diagnostic PSG on 04/07/22.    Please call and notify the patient that the recent sleep study showed mild obstructive sleep apnea. I recommend treatment in the form of autoPAP, which means, that we don't have to bring her back for a second sleep study with CPAP, but will let him try an autoPAP machine at home, through a DME company (of her choice, or as per insurance requirement). The DME representative will educate her on how to use the machine, how to put the mask on, etc. I have placed an order in the chart. Please send referral, talk to patient, send report to referring MD. We will need a FU in sleep clinic for 10 weeks post-PAP set up, please arrange that with me or one of our NPs. Thanks,   Star Age, MD, PhD [facility])

## 2022-04-17 NOTE — Telephone Encounter (Signed)
Contacted pt, LVM rq Cb

## 2022-04-21 NOTE — Telephone Encounter (Signed)
Pt returned phone would like a call back. 

## 2022-04-21 NOTE — Telephone Encounter (Signed)
Contacted pt back, I advised pt that Dr. Rexene Alberts reviewed their sleep study results and found that pt has mild OSA. Dr. Rexene Alberts recommends that pt start autoPAP. I reviewed PAP compliance expectations with the pt. Pt is agreeable to starting a CPAP. I advised pt that an order will be sent to a DME, [facility], and [facility] will call the pt within about one week after they file with the pt's insurance. [facility] will show the pt how to use the machine, fit for masks, and troubleshoot the CPAP if needed. A follow up appt was made for insurance purposes with Amy Lomax NP 07/14/22 at 3:30. Pt verbalized understanding to arrive 15 minutes early and bring their CPAP. Pt verbalized understanding of results. Pt had no questions at this time but was encouraged to call back if questions arise. I have sent the order to [place] and have received confirmation that they have received the order.

## 2022-05-07 DIAGNOSIS — R400 Somnolence: Secondary | ICD-10-CM | POA: Diagnosis not present

## 2022-05-07 DIAGNOSIS — G4733 Obstructive sleep apnea (adult) (pediatric): Secondary | ICD-10-CM | POA: Diagnosis not present

## 2022-05-12 ENCOUNTER — Ambulatory Visit (INDEPENDENT_AMBULATORY_CARE_PROVIDER_SITE_OTHER): Payer: Federal, State, Local not specified - PPO | Admitting: *Deleted

## 2022-05-12 DIAGNOSIS — Z3042 Encounter for surveillance of injectable contraceptive: Secondary | ICD-10-CM | POA: Diagnosis not present

## 2022-05-12 MED ORDER — MEDROXYPROGESTERONE ACETATE 150 MG/ML IM SUSP
150.0000 mg | Freq: Once | INTRAMUSCULAR | Status: AC
  Administered 2022-05-12: 150 mg via INTRAMUSCULAR

## 2022-05-12 NOTE — Progress Notes (Signed)
   NURSE VISIT- INJECTION  SUBJECTIVE:  Sherry Garrett is a 47 y.o. [phone] female here for a Depo Provera for contraception/period management. She is a GYN patient.   OBJECTIVE:  There were no vitals taken for this visit.  Appears well, in no apparent distress  Injection administered in: Right deltoid  Meds ordered this encounter  Medications   medroxyPROGESTERone (DEPO-PROVERA) injection 150 mg    ASSESSMENT: GYN patient Depo Provera for contraception/period management PLAN: Follow-up: in 11-13 weeks for next Depo   Alice Rieger  05/12/2022 4:26 PM

## 2022-06-05 DIAGNOSIS — G4733 Obstructive sleep apnea (adult) (pediatric): Secondary | ICD-10-CM | POA: Diagnosis not present

## 2022-06-05 DIAGNOSIS — R400 Somnolence: Secondary | ICD-10-CM | POA: Diagnosis not present

## 2022-06-27 ENCOUNTER — Other Ambulatory Visit (INDEPENDENT_AMBULATORY_CARE_PROVIDER_SITE_OTHER): Payer: Federal, State, Local not specified - PPO

## 2022-06-27 ENCOUNTER — Other Ambulatory Visit: Payer: Self-pay | Admitting: Adult Health

## 2022-06-27 DIAGNOSIS — R350 Frequency of micturition: Secondary | ICD-10-CM

## 2022-06-27 DIAGNOSIS — R300 Dysuria: Secondary | ICD-10-CM | POA: Diagnosis not present

## 2022-06-27 DIAGNOSIS — R319 Hematuria, unspecified: Secondary | ICD-10-CM | POA: Diagnosis not present

## 2022-06-27 LAB — POCT URINALYSIS DIPSTICK OB
Glucose, UA: NEGATIVE
Ketones, UA: NEGATIVE
Nitrite, UA: NEGATIVE
POC,PROTEIN,UA: NEGATIVE

## 2022-06-27 MED ORDER — SULFAMETHOXAZOLE-TRIMETHOPRIM 800-160 MG PO TABS: 1.0000 | ORAL_TABLET | Freq: Two times a day (BID) | ORAL | 0 refills | Status: DC

## 2022-06-27 NOTE — Progress Notes (Signed)
Rx septra ds 

## 2022-06-27 NOTE — Progress Notes (Signed)
   NURSE VISIT- UTI SYMPTOMS   SUBJECTIVE:  Sherry Garrett is a 47 y.o. [phone] female here for UTI symptoms. She is a GYN patient. She reports dysuria, flank pain bilaterally, hematuria, nausea, and urinary frequency.  OBJECTIVE:  There were no vitals taken for this visit.  Appears well, in no apparent distress  Results for orders placed or performed in visit on 06/27/22 (from the past 24 hour(s))  POC Urinalysis Dipstick OB   Collection Time: 06/27/22 12:34 PM  Result Value Ref Range   Color, UA     Clarity, UA     Glucose, UA Negative Negative   Bilirubin, UA     Ketones, UA neg    Spec Grav, UA     Blood, UA moderate    pH, UA     POC,PROTEIN,UA Negative Negative, Trace, Small (1+), Moderate (2+), Large (3+), 4+   Urobilinogen, UA     Nitrite, UA neg    Leukocytes, UA Small (1+) (A) Negative   Appearance     Odor      ASSESSMENT: GYN patient with UTI symptoms and negative nitrites  PLAN: Note routed to Cyril Mourning, AGNP   Rx sent by provider today: Yes, pt requesting treatment now as she is traveling out of town next Wednesday and would like for this to be cleared before then Urine culture sent Call or return to clinic prn if these symptoms worsen or fail to improve as anticipated. Follow-up: as needed   Jobe Marker  06/27/2022 12:34 PM

## 2022-06-28 LAB — URINALYSIS, ROUTINE W REFLEX MICROSCOPIC
Bilirubin, UA: NEGATIVE
Glucose, UA: NEGATIVE
Ketones, UA: NEGATIVE
Nitrite, UA: NEGATIVE
Protein,UA: NEGATIVE
Specific Gravity, UA: 1.008 (ref 1.005–1.030)
Urobilinogen, Ur: 0.2 mg/dL (ref 0.2–1.0)
pH, UA: 7 (ref 5.0–7.5)

## 2022-06-28 LAB — MICROSCOPIC EXAMINATION
Bacteria, UA: NONE SEEN
Casts: NONE SEEN /lpf
RBC, Urine: NONE SEEN /hpf (ref 0–2)

## 2022-07-01 LAB — URINE CULTURE

## 2022-07-06 DIAGNOSIS — G4733 Obstructive sleep apnea (adult) (pediatric): Secondary | ICD-10-CM | POA: Diagnosis not present

## 2022-07-06 DIAGNOSIS — R400 Somnolence: Secondary | ICD-10-CM | POA: Diagnosis not present

## 2022-07-14 ENCOUNTER — Ambulatory Visit: Payer: Federal, State, Local not specified - PPO | Admitting: Family Medicine

## 2022-07-14 ENCOUNTER — Encounter: Payer: Self-pay | Admitting: Family Medicine

## 2022-07-14 VITALS — BP 131/75 | HR 90 | Ht 62.0 in | Wt 178.0 lb

## 2022-07-14 DIAGNOSIS — G4733 Obstructive sleep apnea (adult) (pediatric): Secondary | ICD-10-CM

## 2022-07-14 NOTE — Patient Instructions (Signed)
Please continue using your CPAP regularly. While your insurance requires that you use CPAP at least 4 hours each night on 70% of the nights, I recommend, that you not skip any nights and use it throughout the night if you can. Getting used to CPAP and staying with the treatment long term does take time and patience and discipline. Untreated obstructive sleep apnea when it is moderate to severe can have an adverse impact on cardiovascular health and raise her risk for heart disease, arrhythmias, hypertension, congestive heart failure, stroke and diabetes. Untreated obstructive sleep apnea causes sleep disruption, nonrestorative sleep, and sleep deprivation. This can have an impact on your day to day functioning and cause daytime sleepiness and impairment of cognitive function, memory loss, mood disturbance, and problems focussing. Using CPAP regularly can improve these symptoms.  We will update supply orders, today. I will recheck your pressure settings in 6-8 weeks. Let me know if you need anything.   Follow up in 1 year

## 2022-07-14 NOTE — Progress Notes (Signed)
PATIENT: Sherry Garrett DOB: 12-11-1975  REASON FOR VISIT: follow up HISTORY FROM: patient  Chief Complaint  Patient presents with   Follow-up    Pt in 11. Room Here for cpap follow up. Pt report doing well on cpap uses nightly. Pt said she was sick and didn't wear cpap, but once she felt better she used cpap.     HISTORY OF PRESENT ILLNESS:  07/14/22 ALL:  Sherry Garrett is a 47 y.o. female here today for follow up for OSA on CPAP.  She was seen in consult with Dr Frances Furbish 01/2022 for excessive sleepiness and snoring. PSG 04/07/2022 showed overall mild OSA with total AHI of 8.5/hr, supine AHI 57.5/h and O2 nadir of 87%. AutoPAP advised. She is doing well. She reports having difficulty tolerating therapy in the beginning but with consistency she has done well. She was given a machine from a family member that she uses when traveling. She does report feeling better rested. She feels more groggy when not using CPAP. She is no longer snoring. She denies concerns with machine or supplies.     HISTORY: (copied from Dr Teofilo Pod previous note)  Dear Victorino Dike,   I saw your patient, Sherry Garrett, upon your kind request in my sleep clinic today for initial consultation of her sleep disorder, in particular, concern for underlying obstructive sleep apnea.  The patient is unaccompanied today.  As you know, Ms. Kruser is a 47 year old female with an underlying medical history of hypertension, hyperlipidemia, smoking, abnormal uterine bleeding, anxiety and mild obesity, who reports snoring and excessive daytime somnolence.  I reviewed your office note from 01/07/2022.  Her Epworth sleepiness score is 11 out of 24, fatigue severity score is 48 out of 63.  She has had witnessed apneas and has also woken herself up with a sense of gasping for air.  Her mom and maternal grandmother have sleep apnea, both have PAP machines.  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  She lives with her family  which includes her husband and 2 younger children, ages 76 and 63, she has a 1 year old as well.  She smokes about a pack per day, she has worked on smoking cessation but has not been fully successful.  She drinks quite a bit of caffeine in the form of coffee, 4 large cups per day in the mornings, and 1 soda or cold coffee later on.  She has had rare morning headaches, denies night to night nocturia.  She is a restless sleeper and does not wake up rested.  She has 1 dog and 1 cat in the household, she does not share a bedroom with her husband currently as her snoring is too loud for him and he is a light sleeper.  The dog sleeps on her bed typically.   REVIEW OF SYSTEMS: Out of a complete 14 system review of symptoms, the patient complains only of the following symptoms, none and all other reviewed systems are negative.  ESS: 5/24, previously 11/24  ALLERGIES: No Known Allergies  HOME MEDICATIONS: Outpatient Medications Prior to Visit  Medication Sig Dispense Refill   fexofenadine (ALLEGRA) 180 MG tablet Take 1 tablet (180 mg total) by mouth daily. 30 tablet PRN   losartan-hydrochlorothiazide (HYZAAR) 50-12.5 MG tablet Take 1 tablet by mouth daily. 90 tablet 3   medroxyPROGESTERone Acetate 150 MG/ML [name] INJECT 1 ML INTO THE MUSCLE EVERY 3 MONTHS. 1 mL 4   sulfamethoxazole-trimethoprim (BACTRIM DS) 800-160 MG tablet Take  1 tablet by mouth 2 (two) times daily. Take 1 bid 14 tablet 0   Triamcinolone Acetonide (NASACORT ALLERGY 24HR NA) Place into the nose as needed.     rosuvastatin (CRESTOR) 20 MG tablet Take 1 tablet (20 mg total) by mouth daily. (Patient not taking: Reported on 02/13/2022) 30 tablet 3   No facility-administered medications prior to visit.    PAST MEDICAL HISTORY: Past Medical History:  Diagnosis Date   Benign essential hypertension 2019   Genital warts    Hematuria    HPV (human papilloma virus) infection    Pre-eclampsia     PAST SURGICAL HISTORY: Past Surgical  History:  Procedure Laterality Date   APPENDECTOMY     LASER ABLATION OF THE CERVIX  2005    FAMILY HISTORY: Family History  Problem Relation Age of Onset   Sleep apnea Mother    Diabetes Father    Heart attack Father    Hypertension Father    Cancer Father 71       cancer in spinal fluid   Diabetes Brother    Stroke Maternal Grandmother        mini strokes   Sleep apnea Maternal Grandmother    Cancer Maternal Grandfather        prostate   Diabetes Paternal Grandmother    Heart attack Paternal Grandmother    Cancer Paternal Grandfather    Cancer Other    Hypertension Other     SOCIAL HISTORY: Social History   Socioeconomic History   Marital status: Married    Spouse name: Not on file   Number of children: 3   Years of education: Not on file   Highest education level: Not on file  Occupational History   Not on file  Tobacco Use   Smoking status: Every Day    Packs/day: 0.50    Years: 17.00    Additional pack years: 0.00    Total pack years: 8.50    Types: Cigarettes   Smokeless tobacco: Never  Vaping Use   Vaping Use: Never used  Substance and Sexual Activity   Alcohol use: Not Currently    Alcohol/week: 1.0 standard drink of alcohol    Types: 1 Standard drinks or equivalent per week    Comment: occ   Drug use: No   Sexual activity: Yes    Birth control/protection: Injection  Other Topics Concern   Not on file  Social History Narrative   Lives with Husband   Right Handed   1/2 pot of Coffee in the Morning   1 can of Cold Coffee   Social Determinants of Health   Financial Resource Strain: Low Risk  (01/07/2022)   Overall Financial Resource Strain (CARDIA)    Difficulty of Paying Living Expenses: Not very hard  Food Insecurity: No Food Insecurity (01/07/2022)   Hunger Vital Sign    Worried About Running Out of Food in the Last Year: Never true    Ran Out of Food in the Last Year: Never true  Transportation Needs: No Transportation Needs  (01/07/2022)   PRAPARE - Administrator, Civil Service (Medical): No    Lack of Transportation (Non-Medical): No  Physical Activity: Insufficiently Active (01/07/2022)   Exercise Vital Sign    Days of Exercise per Week: 1 day    Minutes of Exercise per Session: 20 min  Stress: Stress Concern Present (01/07/2022)   [employer] of Occupational Health - Occupational Stress Questionnaire    Feeling of Stress :  To some extent  Social Connections: Moderately Integrated (01/07/2022)   Social Connection and Isolation Panel [NHANES]    Frequency of Communication with Friends and Family: More than three times a week    Frequency of Social Gatherings with Friends and Family: Once a week    Attends Religious Services: 1 to 4 times per year    Active Member of [employer] or Organizations: No    Attends Banker Meetings: Never    Marital Status: Married  Catering manager Violence: Not At Risk (01/07/2022)   Humiliation, Afraid, Rape, and Kick questionnaire    Fear of Current or Ex-Partner: No    Emotionally Abused: No    Physically Abused: No    Sexually Abused: No     PHYSICAL EXAM  Vitals:   07/14/22 1523  BP: 131/75  Pulse: 90  Weight: 178 lb (80.7 kg)  Height: 5\' 2"  (1.575 m)   Body mass index is 32.56 kg/m.  Generalized: Well developed, in no acute distress  Cardiology: normal rate and rhythm, no murmur noted Respiratory: clear to auscultation bilaterally  Neurological examination  Mentation: Alert oriented to time, place, history taking. Follows all commands speech and language fluent Cranial nerve II-XII: Pupils were equal round reactive to light. Extraocular movements were full, visual field were full  Motor: The motor testing reveals 5 over 5 strength of all 4 extremities. Good symmetric motor tone is noted throughout.  Gait and station: Gait is normal.    DIAGNOSTIC DATA (LABS, IMAGING, TESTING) - I reviewed patient records, labs, notes, testing  and imaging myself where available.      No data to display           Lab Results  Component Value Date   WBC 11.6 (H) 01/08/2022   HGB 15.1 01/08/2022   HCT 44.2 01/08/2022   MCV 90 01/08/2022   PLT 342 01/08/2022      Component Value Date/Time   NA 140 01/08/2022 0805   K 4.1 01/08/2022 0805   CL 102 01/08/2022 0805   CO2 21 01/08/2022 0805   GLUCOSE 101 (H) 01/08/2022 0805   GLUCOSE 104 (H) 07/14/2014 0459   BUN 9 01/08/2022 0805   CREATININE 0.85 01/08/2022 0805   CALCIUM 9.7 01/08/2022 0805   PROT 7.1 01/08/2022 0805   ALBUMIN 4.8 01/08/2022 0805   AST 16 01/08/2022 0805   ALT 13 01/08/2022 0805   ALKPHOS 69 01/08/2022 0805   BILITOT 0.3 01/08/2022 0805   GFRNONAA 108 08/25/2017 1603   GFRAA 125 08/25/2017 1603   Lab Results  Component Value Date   CHOL 184 01/08/2022   HDL 29 (L) 01/08/2022   LDLCALC 122 (H) 01/08/2022   TRIG 187 (H) 01/08/2022   CHOLHDL 6.3 (H) 01/08/2022   No results found for: "HGBA1C" No results found for: "VITAMINB12" Lab Results  Component Value Date   TSH 1.050 08/22/2020     ASSESSMENT AND PLAN 47 y.o. year old female  has a past medical history of Benign essential hypertension (2019), Genital warts, Hematuria, HPV (human papilloma virus) infection, and Pre-eclampsia. here with     ICD-10-CM   1. OSA on CPAP  G47.33         Sherry Garrett is doing well on CPAP therapy. Compliance report reveals excellent compliance.She was encouraged to continue using CPAP nightly and for greater than 4 hours each night. I will check AHI and pressure settings in 6-8 weeks. We will update supply orders as indicated. Risks  of untreated sleep apnea review and education materials provided. Healthy lifestyle habits encouraged. She will follow up in 1 year, sooner if needed. She verbalizes understanding and agreement with this plan.    No orders of the defined types were placed in this encounter.    No orders of the defined types were placed  in this encounter.     Shawnie Dapper, FNP-C 07/14/2022, 3:57 PM [facility] [address], [address] [place], [place] [address] [phone]

## 2022-08-04 ENCOUNTER — Ambulatory Visit (INDEPENDENT_AMBULATORY_CARE_PROVIDER_SITE_OTHER): Payer: Federal, State, Local not specified - PPO | Admitting: *Deleted

## 2022-08-04 DIAGNOSIS — Z3042 Encounter for surveillance of injectable contraceptive: Secondary | ICD-10-CM

## 2022-08-04 MED ORDER — MEDROXYPROGESTERONE ACETATE 150 MG/ML IM SUSY
150.0000 mg | PREFILLED_SYRINGE | Freq: Once | INTRAMUSCULAR | Status: AC
  Administered 2022-08-04: 150 mg via INTRAMUSCULAR

## 2022-08-04 NOTE — Progress Notes (Signed)
   NURSE VISIT- INJECTION  SUBJECTIVE:  Sherry Garrett is a 47 y.o. [phone] female here for a Depo Provera for contraception/period management. She is a GYN patient.   OBJECTIVE:  There were no vitals taken for this visit.  Appears well, in no apparent distress  Injection administered in: Left deltoid  No orders of the defined types were placed in this encounter.   ASSESSMENT: GYN patient Depo Provera for contraception/period management PLAN: Follow-up: in 11-13 weeks for next Depo   PURVI RIDGELL  08/04/2022 4:27 PM

## 2022-08-05 DIAGNOSIS — G4733 Obstructive sleep apnea (adult) (pediatric): Secondary | ICD-10-CM | POA: Diagnosis not present

## 2022-08-05 DIAGNOSIS — R400 Somnolence: Secondary | ICD-10-CM | POA: Diagnosis not present

## 2022-08-28 ENCOUNTER — Encounter: Payer: Self-pay | Admitting: Family Medicine

## 2022-08-28 ENCOUNTER — Telehealth: Payer: Self-pay | Admitting: Family Medicine

## 2022-08-28 NOTE — Telephone Encounter (Signed)
Can you please send me a cpy of most recent compliance report. TY!

## 2022-09-05 DIAGNOSIS — R400 Somnolence: Secondary | ICD-10-CM | POA: Diagnosis not present

## 2022-09-05 DIAGNOSIS — G4733 Obstructive sleep apnea (adult) (pediatric): Secondary | ICD-10-CM | POA: Diagnosis not present

## 2022-10-05 DIAGNOSIS — G4733 Obstructive sleep apnea (adult) (pediatric): Secondary | ICD-10-CM | POA: Diagnosis not present

## 2022-10-05 DIAGNOSIS — R400 Somnolence: Secondary | ICD-10-CM | POA: Diagnosis not present

## 2022-10-27 ENCOUNTER — Ambulatory Visit: Payer: Federal, State, Local not specified - PPO

## 2022-10-27 DIAGNOSIS — Z3042 Encounter for surveillance of injectable contraceptive: Secondary | ICD-10-CM

## 2022-10-27 MED ORDER — MEDROXYPROGESTERONE ACETATE 150 MG/ML IM SUSY
150.0000 mg | PREFILLED_SYRINGE | Freq: Once | INTRAMUSCULAR | Status: AC
  Administered 2022-10-27: 150 mg via INTRAMUSCULAR

## 2022-10-27 NOTE — Progress Notes (Signed)
   NURSE VISIT- INJECTION  SUBJECTIVE:  Sherry Garrett is a 47 y.o. [phone] female here for a Depo Provera for contraception/period management. She is a GYN patient.   OBJECTIVE:  There were no vitals taken for this visit.  Appears well, in no apparent distress  Injection administered in: Right deltoid  Meds ordered this encounter  Medications   medroxyPROGESTERone Acetate [name] 150 mg    ASSESSMENT: GYN patient Depo Provera for contraception/period management PLAN: Follow-up: in 11-13 weeks for next Depo   Caralyn Guile  10/27/2022 4:47 PM

## 2022-11-05 DIAGNOSIS — R400 Somnolence: Secondary | ICD-10-CM | POA: Diagnosis not present

## 2022-11-05 DIAGNOSIS — G4733 Obstructive sleep apnea (adult) (pediatric): Secondary | ICD-10-CM | POA: Diagnosis not present

## 2022-11-27 DIAGNOSIS — R400 Somnolence: Secondary | ICD-10-CM | POA: Diagnosis not present

## 2022-11-27 DIAGNOSIS — G4733 Obstructive sleep apnea (adult) (pediatric): Secondary | ICD-10-CM | POA: Diagnosis not present

## 2022-12-01 DIAGNOSIS — R300 Dysuria: Secondary | ICD-10-CM | POA: Diagnosis not present

## 2022-12-01 DIAGNOSIS — E669 Obesity, unspecified: Secondary | ICD-10-CM | POA: Diagnosis not present

## 2022-12-01 DIAGNOSIS — Z6832 Body mass index (BMI) 32.0-32.9, adult: Secondary | ICD-10-CM | POA: Diagnosis not present

## 2022-12-01 DIAGNOSIS — M545 Low back pain, unspecified: Secondary | ICD-10-CM | POA: Diagnosis not present

## 2022-12-06 DIAGNOSIS — G4733 Obstructive sleep apnea (adult) (pediatric): Secondary | ICD-10-CM | POA: Diagnosis not present

## 2022-12-06 DIAGNOSIS — R400 Somnolence: Secondary | ICD-10-CM | POA: Diagnosis not present

## 2022-12-15 ENCOUNTER — Other Ambulatory Visit (INDEPENDENT_AMBULATORY_CARE_PROVIDER_SITE_OTHER): Payer: Federal, State, Local not specified - PPO | Admitting: *Deleted

## 2022-12-15 DIAGNOSIS — R350 Frequency of micturition: Secondary | ICD-10-CM | POA: Diagnosis not present

## 2022-12-15 DIAGNOSIS — R300 Dysuria: Secondary | ICD-10-CM

## 2022-12-15 DIAGNOSIS — M545 Low back pain, unspecified: Secondary | ICD-10-CM

## 2022-12-15 LAB — POCT URINALYSIS DIPSTICK
Glucose, UA: NEGATIVE
Ketones, UA: NEGATIVE
Nitrite, UA: NEGATIVE
Protein, UA: POSITIVE — AB

## 2022-12-15 NOTE — Progress Notes (Signed)
   NURSE VISIT- UTI SYMPTOMS   SUBJECTIVE:  Sherry Garrett is a 47 y.o. [phone] female here for UTI symptoms. She is a GYN patient. She reports urinary frequency and burning with urination and low back pain X 4 days. Pt had UTI on 9/16. Was seen at Urgent Care and took all of antibiotic.   OBJECTIVE:  There were no vitals taken for this visit.  Appears well, in no apparent distress  Results for orders placed or performed in visit on 12/15/22 (from the past 24 hour(s))  POCT Urinalysis Dipstick   Collection Time: 12/15/22  3:16 PM  Result Value Ref Range   Color, UA     Clarity, UA     Glucose, UA Negative Negative   Bilirubin, UA     Ketones, UA neg    Spec Grav, UA     Blood, UA 3+    pH, UA     Protein, UA Positive (A) Negative   Urobilinogen, UA     Nitrite, UA neg    Leukocytes, UA Trace (A) Negative   Appearance     Odor      ASSESSMENT: GYN patient with UTI symptoms and negative nitrites  PLAN: Note routed to Cyril Mourning, AGNP   Rx sent by provider today: No Urine culture sent Call or return to clinic prn if these symptoms worsen or fail to improve as anticipated. Follow-up: as needed   Malachy Mood  12/15/2022 3:24 PM

## 2022-12-16 LAB — URINALYSIS, ROUTINE W REFLEX MICROSCOPIC
Bilirubin, UA: NEGATIVE
Glucose, UA: NEGATIVE
Ketones, UA: NEGATIVE
Leukocytes,UA: NEGATIVE
Nitrite, UA: NEGATIVE
Specific Gravity, UA: 1.012 (ref 1.005–1.030)
Urobilinogen, Ur: 0.2 mg/dL (ref 0.2–1.0)
pH, UA: 7 (ref 5.0–7.5)

## 2022-12-16 LAB — MICROSCOPIC EXAMINATION
Bacteria, UA: NONE SEEN
Casts: NONE SEEN /[LPF]
RBC, Urine: >30 /[HPF] — AB (ref 0–2)

## 2022-12-17 ENCOUNTER — Other Ambulatory Visit: Payer: Self-pay | Admitting: Adult Health

## 2022-12-17 LAB — URINE CULTURE

## 2022-12-17 MED ORDER — SULFAMETHOXAZOLE-TRIMETHOPRIM 800-160 MG PO TABS: 1.0000 | ORAL_TABLET | Freq: Two times a day (BID) | ORAL | 0 refills | Status: DC

## 2022-12-17 NOTE — Progress Notes (Signed)
Urine + E coli, will rx septra ds

## 2023-01-05 DIAGNOSIS — R400 Somnolence: Secondary | ICD-10-CM | POA: Diagnosis not present

## 2023-01-05 DIAGNOSIS — G4733 Obstructive sleep apnea (adult) (pediatric): Secondary | ICD-10-CM | POA: Diagnosis not present

## 2023-01-26 ENCOUNTER — Ambulatory Visit (INDEPENDENT_AMBULATORY_CARE_PROVIDER_SITE_OTHER): Payer: Federal, State, Local not specified - PPO | Admitting: *Deleted

## 2023-01-26 DIAGNOSIS — Z3042 Encounter for surveillance of injectable contraceptive: Secondary | ICD-10-CM | POA: Diagnosis not present

## 2023-01-26 MED ORDER — MEDROXYPROGESTERONE ACETATE 150 MG/ML IM SUSY
150.0000 mg | PREFILLED_SYRINGE | Freq: Once | INTRAMUSCULAR | Status: AC
  Administered 2023-01-26: 150 mg via INTRAMUSCULAR

## 2023-01-26 NOTE — Progress Notes (Signed)
   NURSE VISIT- INJECTION  SUBJECTIVE:  Sherry Garrett is a 47 y.o. [phone] female here for a Depo Provera for contraception/period management. She is a GYN patient.   OBJECTIVE:  There were no vitals taken for this visit.  Appears well, in no apparent distress  Injection administered in: Left deltoid  Meds ordered this encounter  Medications   medroxyPROGESTERone Acetate [name] 150 mg    ASSESSMENT: GYN patient Depo Provera for contraception/period management PLAN: Follow-up: in 11-13 weeks for next Depo   DORATHY BODENHAMER  01/26/2023 4:07 PM

## 2023-02-05 DIAGNOSIS — R400 Somnolence: Secondary | ICD-10-CM | POA: Diagnosis not present

## 2023-02-05 DIAGNOSIS — G4733 Obstructive sleep apnea (adult) (pediatric): Secondary | ICD-10-CM | POA: Diagnosis not present

## 2023-02-25 ENCOUNTER — Other Ambulatory Visit: Payer: Self-pay | Admitting: *Deleted

## 2023-02-25 MED ORDER — LOSARTAN POTASSIUM-HCTZ 50-12.5 MG PO TABS: 1.0000 | ORAL_TABLET | Freq: Every day | ORAL | 3 refills | Status: DC

## 2023-04-09 ENCOUNTER — Other Ambulatory Visit: Payer: Self-pay | Admitting: Adult Health

## 2023-04-20 ENCOUNTER — Ambulatory Visit: Payer: Federal, State, Local not specified - PPO | Admitting: *Deleted

## 2023-04-20 DIAGNOSIS — Z3042 Encounter for surveillance of injectable contraceptive: Secondary | ICD-10-CM | POA: Diagnosis not present

## 2023-04-20 MED ORDER — MEDROXYPROGESTERONE ACETATE 150 MG/ML IM SUSY
150.0000 mg | PREFILLED_SYRINGE | Freq: Once | INTRAMUSCULAR | Status: AC
  Administered 2023-04-20: 150 mg via INTRAMUSCULAR

## 2023-04-20 NOTE — Progress Notes (Signed)
   NURSE VISIT- INJECTION  SUBJECTIVE:  Sherry Garrett is a 48 y.o. [phone] female here for a Depo Provera for contraception/period management. She is a GYN patient.   OBJECTIVE:  There were no vitals taken for this visit.  Appears well, in no apparent distress  Injection administered in: Right deltoid  Meds ordered this encounter  Medications   medroxyPROGESTERone Acetate [name] 150 mg    ASSESSMENT: GYN patient Depo Provera for contraception/period management PLAN: Follow-up: in 11-13 weeks for next Depo; schedule pap/physical   Malachy Mood  04/20/2023 4:04 PM

## 2023-05-12 ENCOUNTER — Ambulatory Visit: Payer: Federal, State, Local not specified - PPO | Admitting: Obstetrics & Gynecology

## 2023-05-19 ENCOUNTER — Telehealth: Payer: Self-pay | Admitting: Family Medicine

## 2023-05-19 NOTE — Telephone Encounter (Signed)
 LVM and sent mychart msg informing pt of need to reschedule 07/14/23 appt - NP out

## 2023-05-26 ENCOUNTER — Ambulatory Visit: Payer: Federal, State, Local not specified - PPO | Admitting: Obstetrics & Gynecology

## 2023-06-05 ENCOUNTER — Other Ambulatory Visit (HOSPITAL_COMMUNITY)
Admission: RE | Admit: 2023-06-05 | Discharge: 2023-06-05 | Disposition: A | Discharge status: Home / Self Care | Source: Ambulatory Visit | Attending: Obstetrics & Gynecology | Admitting: Obstetrics & Gynecology

## 2023-06-05 ENCOUNTER — Encounter: Payer: Self-pay | Admitting: Obstetrics & Gynecology

## 2023-06-05 ENCOUNTER — Ambulatory Visit: Admitting: Obstetrics & Gynecology

## 2023-06-05 VITALS — BP 126/88 | HR 71 | Ht 62.75 in | Wt 177.0 lb

## 2023-06-05 DIAGNOSIS — Z01419 Encounter for gynecological examination (general) (routine) without abnormal findings: Secondary | ICD-10-CM

## 2023-06-05 NOTE — Progress Notes (Signed)
 Subjective:     Sherry Garrett is a 48 y.o. female here for a routine exam.  No LMP recorded. Patient has had an injection. [id number] Birth Control Method:  Depo provera Menstrual Calendar(currently): amenorrhea on depo  Current complaints: some vasomotor symptoms.   Current acute medical issues:     Recent Gynecologic History No LMP recorded. Patient has had an injection. Last Pap: 2022,   Last mammogram: 2023,  normal  Past Medical History:  Diagnosis Date   Benign essential hypertension 2019   Genital warts    Hematuria    HPV (human papilloma virus) infection    Pre-eclampsia    Sleep apnea    uses CPAP    Past Surgical History:  Procedure Laterality Date   APPENDECTOMY     LASER ABLATION OF THE CERVIX  2005    OB History     Gravida  4   Para  3   Term  3   Preterm      AB  1   Living  3      SAB  1   IAB      Ectopic      Multiple  0   Live Births  3           Social History   Socioeconomic History   Marital status: Married    Spouse name: Not on file   Number of children: 3   Years of education: Not on file   Highest education level: Not on file  Occupational History   Not on file  Tobacco Use   Smoking status: Every Day    Current packs/day: 0.50    Average packs/day: 0.5 packs/day for 17.0 years (8.5 ttl pk-yrs)    Types: Cigarettes   Smokeless tobacco: Never  Vaping Use   Vaping status: Never Used  Substance and Sexual Activity   Alcohol use: Not Currently    Alcohol/week: 1.0 standard drink of alcohol    Types: 1 Standard drinks or equivalent per week    Comment: occ   Drug use: No   Sexual activity: Yes    Birth control/protection: Injection  Other Topics Concern   Not on file  Social History Narrative   Lives with Husband   Right Handed   1/2 pot of Coffee in the Morning   1 can of Cold Coffee   Social Drivers of Health   Financial Resource Strain: Low Risk  (06/05/2023)   Overall Financial Resource Strain  (CARDIA)    Difficulty of Paying Living Expenses: Not very hard  Food Insecurity: No Food Insecurity (06/05/2023)   Hunger Vital Sign    Worried About Running Out of Food in the Last Year: Never true    Ran Out of Food in the Last Year: Never true  Transportation Needs: No Transportation Needs (06/05/2023)   PRAPARE - Administrator, Civil Service (Medical): No    Lack of Transportation (Non-Medical): No  Physical Activity: Insufficiently Active (06/05/2023)   Exercise Vital Sign    Days of Exercise per Week: 1 day    Minutes of Exercise per Session: 30 min  Stress: No Stress Concern Present (06/05/2023)   [employer] of Occupational Health - Occupational Stress Questionnaire    Feeling of Stress : Only a little  Social Connections: Moderately Integrated (06/05/2023)   Social Connection and Isolation Panel [NHANES]    Frequency of Communication with Friends and Family: More than three times a week  Frequency of Social Gatherings with Friends and Family: Once a week    Attends Religious Services: 1 to 4 times per year    Active Member of [employer] or Organizations: No    Attends Engineer, structural: Never    Marital Status: Married    Family History  Problem Relation Age of Onset   Sleep apnea Mother    Diabetes Father    Heart attack Father    Hypertension Father    Cancer Father 69       cancer in spinal fluid   Diabetes Brother    Stroke Maternal Grandmother        mini strokes   Sleep apnea Maternal Grandmother    Cancer Maternal Grandfather        prostate   Diabetes Paternal Grandmother    Heart attack Paternal Grandmother    Cancer Paternal Grandfather    Cancer Other    Hypertension Other      Current Outpatient Medications:    fexofenadine (ALLEGRA) 180 MG tablet, Take 1 tablet (180 mg total) by mouth daily., Disp: 30 tablet, Rfl: PRN   losartan-hydrochlorothiazide (HYZAAR) 50-12.5 MG tablet, Take 1 tablet by mouth daily., Disp: 90  tablet, Rfl: 3   medroxyPROGESTERone Acetate 150 MG/ML [name], INJECT 1 ML INTO THE MUSCLE EVERY 3 MONTHS., Disp: 1 mL, Rfl: 4   Triamcinolone Acetonide (NASACORT ALLERGY 24HR NA), Place into the nose as needed., Disp: , Rfl:    rosuvastatin (CRESTOR) 20 MG tablet, Take 1 tablet (20 mg total) by mouth daily. (Patient not taking: Reported on 06/05/2023), Disp: 30 tablet, Rfl: 3  Review of Systems  Review of Systems  Constitutional: Negative for fever, chills, weight loss, malaise/fatigue and diaphoresis.  HENT: Negative for hearing loss, ear pain, nosebleeds, congestion, sore throat, neck pain, tinnitus and ear discharge.   Eyes: Negative for blurred vision, double vision, photophobia, pain, discharge and redness.  Respiratory: Negative for cough, hemoptysis, sputum production, shortness of breath, wheezing and stridor.   Cardiovascular: Negative for chest pain, palpitations, orthopnea, claudication, leg swelling and PND.  Gastrointestinal: negative for abdominal pain. Negative for heartburn, nausea, vomiting, diarrhea, constipation, blood in stool and melena.  Genitourinary: Negative for dysuria, urgency, frequency, hematuria and flank pain.  Musculoskeletal: Negative for myalgias, back pain, joint pain and falls.  Skin: Negative for itching and rash.  Neurological: Negative for dizziness, tingling, tremors, sensory change, speech change, focal weakness, seizures, loss of consciousness, weakness and headaches.  Endo/Heme/Allergies: Negative for environmental allergies and polydipsia. Does not bruise/bleed easily.  Psychiatric/Behavioral: Negative for depression, suicidal ideas, hallucinations, memory loss and substance abuse. The patient is not nervous/anxious and does not have insomnia.        Objective:  Blood pressure 126/88, pulse 71, height 5' 2.75" (1.594 m), weight 177 lb (80.3 kg).   Physical Exam  Vitals reviewed. Constitutional: She is oriented to person, place, and time. She  appears well-developed and well-nourished.  HENT:  Head: Normocephalic and atraumatic.        Right Ear: External ear normal.  Left Ear: External ear normal.  Nose: Nose normal.  Mouth/Throat: Oropharynx is clear and moist.  Eyes: Conjunctivae and EOM are normal. Pupils are equal, round, and reactive to light. Right eye exhibits no discharge. Left eye exhibits no discharge. No scleral icterus.  Neck: Normal range of motion. Neck supple. No tracheal deviation present. No thyromegaly present.  Cardiovascular: Normal rate, regular rhythm, normal heart sounds and intact distal pulses.  Exam reveals  no gallop and no friction rub.   No murmur heard. Respiratory: Effort normal and breath sounds normal. No respiratory distress. She has no wheezes. She has no rales. She exhibits no tenderness.  GI: Soft. Bowel sounds are normal. She exhibits no distension and no mass. There is no tenderness. There is no rebound and no guarding.  Genitourinary:  Breasts no masses skin changes or nipple changes bilaterally      Vulva is normal without lesions Vagina is pink moist without discharge Cervix normal in appearance and pap is done Uterus is normal size shape and contour Adnexa is negative with normal sized ovaries   Musculoskeletal: Normal range of motion. She exhibits no edema and no tenderness.  Neurological: She is alert and oriented to person, place, and time. She has normal reflexes. She displays normal reflexes. No cranial nerve deficit. She exhibits normal muscle tone. Coordination normal.  Skin: Skin is warm and dry. No rash noted. No erythema. No pallor.  Psychiatric: She has a normal mood and affect. Her behavior is normal. Judgment and thought content normal.       Medications Ordered at today's visit: No orders of the defined types were placed in this encounter.   Other orders placed at today's visit: No orders of the defined types were placed in this encounter.    ASSESSMENT +  PLAN:    ICD-10-CM   1. Well woman exam with routine gynecological exam  Z01.419     2. Encounter for gynecological examination with Papanicolaou smear of cervix  Z01.419 Cytology - PAP( [facility])          No follow-ups on file.

## 2023-06-08 ENCOUNTER — Encounter: Payer: Self-pay | Admitting: Obstetrics & Gynecology

## 2023-06-08 LAB — CYTOLOGY - PAP
Adequacy: ABSENT
Chlamydia: NEGATIVE
Comment: NEGATIVE
Comment: NEGATIVE
Comment: NORMAL
Diagnosis: NEGATIVE
High risk HPV: NEGATIVE
Neisseria Gonorrhea: NEGATIVE

## 2023-07-02 ENCOUNTER — Encounter: Payer: Self-pay | Admitting: Obstetrics & Gynecology

## 2023-07-02 ENCOUNTER — Ambulatory Visit: Admitting: Obstetrics & Gynecology

## 2023-07-02 VITALS — BP 129/85 | HR 88 | Ht 62.75 in | Wt 177.0 lb

## 2023-07-02 DIAGNOSIS — Z30430 Encounter for insertion of intrauterine contraceptive device: Secondary | ICD-10-CM | POA: Diagnosis not present

## 2023-07-02 MED ORDER — LEVONORGESTREL 20 MCG/DAY IU IUD
1.0000 | INTRAUTERINE_SYSTEM | Freq: Once | INTRAUTERINE | Status: AC
  Administered 2023-07-02: 1 via INTRAUTERINE

## 2023-07-02 NOTE — Addendum Note (Signed)
 Addended by: Myrl Askew on: 07/02/2023 09:05 AM   Modules accepted: Orders

## 2023-07-02 NOTE — Progress Notes (Signed)
 IUD Insertion Procedure Note  Pre-operative Diagnosis: wants to transition off Depo provera  Post-operative Diagnosis: same  Indications: contraception  Procedure Details  Urine pregnancy test was not done, her depo is still in force The risks (including infection, bleeding, pain, and uterine perforation) and benefits of the procedure were explained to the patient and Written informed consent was obtained.    Cervix cleansed with Betadine. Uterus sounded to 6 cm. IUD inserted without difficulty. String visible and trimmed. Patient tolerated procedure well.  IUD Information: Mirena, Lot # [id number], Expiration date 04/2025.  Condition: Stable  Complications: None  Plan:  The patient was advised to call for any fever or for prolonged or severe pain or bleeding. She was advised to use OTC analgesics as needed for mild to moderate pain.   Attending Physician Documentation: I placed the IUD

## 2023-07-13 ENCOUNTER — Ambulatory Visit: Payer: Federal, State, Local not specified - PPO

## 2023-07-14 ENCOUNTER — Ambulatory Visit: Payer: Federal, State, Local not specified - PPO | Admitting: Family Medicine

## 2023-07-27 ENCOUNTER — Telehealth: Payer: Self-pay | Admitting: Family Medicine

## 2023-07-27 NOTE — Telephone Encounter (Signed)
 Pt called to reschedule Appt . Pt has started new job and Mondays are not open for PT.  Appt Scheduled

## 2023-08-03 ENCOUNTER — Ambulatory Visit: Admitting: Family Medicine

## 2023-11-03 DIAGNOSIS — H00014 Hordeolum externum left upper eyelid: Secondary | ICD-10-CM | POA: Diagnosis not present

## 2023-11-03 DIAGNOSIS — R030 Elevated blood-pressure reading, without diagnosis of hypertension: Secondary | ICD-10-CM | POA: Diagnosis not present

## 2024-02-14 ENCOUNTER — Other Ambulatory Visit: Payer: Self-pay | Admitting: Adult Health

## 2024-03-01 NOTE — Progress Notes (Unsigned)
 PATIENT: Sherry Garrett DOB: January 05, 1976  REASON FOR VISIT: follow up HISTORY FROM: patient  No chief complaint on file.    HISTORY OF PRESENT ILLNESS:  03/01/2024 ALL:  Sherry Garrett returns for follow up for OSA on CPAP.     07/14/2022 ALL:  Sherry Garrett is a 48 y.o. female here today for follow up for OSA on CPAP.  She was seen in consult with Dr Buck 01/2022 for excessive sleepiness and snoring. PSG 04/07/2022 showed overall mild OSA with total AHI of 8.5/hr, supine AHI 57.5/h and O2 nadir of 87%. AutoPAP advised. She is doing well. She reports having difficulty tolerating therapy in the beginning but with consistency she has done well. She was given a machine from a family member that she uses when traveling. She does report feeling better rested. She feels more groggy when not using CPAP. She is no longer snoring. She denies concerns with machine or supplies.     HISTORY: (copied from Dr Obie previous note)  Dear Delon,   I saw your patient, Sherry Garrett, upon your kind request in my sleep clinic today for initial consultation of her sleep disorder, in particular, concern for underlying obstructive sleep apnea.  The patient is unaccompanied today.  As you know, Ms. Mair is a 48 year old female with an underlying medical history of hypertension, hyperlipidemia, smoking, abnormal uterine bleeding, anxiety and mild obesity, who reports snoring and excessive daytime somnolence.  I reviewed your office note from 01/07/2022.  Her Epworth sleepiness score is 11 out of 24, fatigue severity score is 48 out of 63.  She has had witnessed apneas and has also woken herself up with a sense of gasping for air.  Her mom and maternal grandmother have sleep apnea, both have PAP machines.  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  She lives with her family which includes her husband and 2 younger children, ages 17 and 41, she has a 22 year old as well.  She smokes about a pack  per day, she has worked on smoking cessation but has not been fully successful.  She drinks quite a bit of caffeine in the form of coffee, 4 large cups per day in the mornings, and 1 soda or cold coffee later on.  She has had rare morning headaches, denies night to night nocturia.  She is a restless sleeper and does not wake up rested.  She has 1 dog and 1 cat in the household, she does not share a bedroom with her husband currently as her snoring is too loud for him and he is a light sleeper.  The dog sleeps on her bed typically.   REVIEW OF SYSTEMS: Out of a complete 14 system review of symptoms, the patient complains only of the following symptoms, none and all other reviewed systems are negative.  ESS: 5/24, previously 11/24  ALLERGIES: No Known Allergies  HOME MEDICATIONS: Outpatient Medications Prior to Visit  Medication Sig Dispense Refill   fexofenadine  (ALLEGRA ) 180 MG tablet Take 1 tablet (180 mg total) by mouth daily. 30 tablet PRN   losartan -hydrochlorothiazide  (HYZAAR) 50-12.5 MG tablet TAKE ONE TABLET BY MOUTH EVERY DAY 90 tablet 3   medroxyPROGESTERone  Acetate 150 MG/ML [name] INJECT 1 ML INTO THE MUSCLE EVERY 3 MONTHS. 1 mL 4   rosuvastatin  (CRESTOR ) 20 MG tablet Take 1 tablet (20 mg total) by mouth daily. (Patient not taking: Reported on 06/05/2023) 30 tablet 3   Triamcinolone Acetonide (NASACORT ALLERGY 24HR NA) Place  into the nose as needed.     No facility-administered medications prior to visit.    PAST MEDICAL HISTORY: Past Medical History:  Diagnosis Date   Benign essential hypertension 2019   Genital warts    Hematuria    HPV (human papilloma virus) infection    Pre-eclampsia    Sleep apnea    uses CPAP    PAST SURGICAL HISTORY: Past Surgical History:  Procedure Laterality Date   APPENDECTOMY     LASER ABLATION OF THE CERVIX  2005    FAMILY HISTORY: Family History  Problem Relation Age of Onset   Sleep apnea Mother    Diabetes Father    Heart attack  Father    Hypertension Father    Cancer Father 38       cancer in spinal fluid   Diabetes Brother    Stroke Maternal Grandmother        mini strokes   Sleep apnea Maternal Grandmother    Cancer Maternal Grandfather        prostate   Diabetes Paternal Grandmother    Heart attack Paternal Grandmother    Cancer Paternal Grandfather    Cancer Other    Hypertension Other     SOCIAL HISTORY: Social History   Socioeconomic History   Marital status: Married    Spouse name: Not on file   Number of children: 3   Years of education: Not on file   Highest education level: Not on file  Occupational History   Not on file  Tobacco Use   Smoking status: Every Day    Current packs/day: 0.50    Average packs/day: 0.5 packs/day for 17.0 years (8.5 ttl pk-yrs)    Types: Cigarettes   Smokeless tobacco: Never  Vaping Use   Vaping status: Never Used  Substance and Sexual Activity   Alcohol use: Not Currently    Alcohol/week: 1.0 standard drink of alcohol    Types: 1 Standard drinks or equivalent per week    Comment: occ   Drug use: No   Sexual activity: Yes    Birth control/protection: Injection  Other Topics Concern   Not on file  Social History Narrative   Lives with Husband   Right Handed   1/2 pot of Coffee in the Morning   1 can of Cold Coffee   Social Drivers of Health   Tobacco Use: High Risk (07/02/2023)   Patient History    Smoking Tobacco Use: Every Day    Smokeless Tobacco Use: Never    Passive Exposure: Not on file  Financial Resource Strain: Low Risk (06/05/2023)   Overall Financial Resource Strain (CARDIA)    Difficulty of Paying Living Expenses: Not very hard  Food Insecurity: No Food Insecurity (06/05/2023)   Hunger Vital Sign    Worried About Running Out of Food in the Last Year: Never true    Ran Out of Food in the Last Year: Never true  Transportation Needs: No Transportation Needs (06/05/2023)   PRAPARE - Administrator, Civil Service (Medical):  No    Lack of Transportation (Non-Medical): No  Physical Activity: Insufficiently Active (06/05/2023)   Exercise Vital Sign    Days of Exercise per Week: 1 day    Minutes of Exercise per Session: 30 min  Stress: No Stress Concern Present (06/05/2023)   [employer] of Occupational Health - Occupational Stress Questionnaire    Feeling of Stress : Only a little  Social Connections: Moderately Integrated (06/05/2023)  Social Connection and Isolation Panel    Frequency of Communication with Friends and Family: More than three times a week    Frequency of Social Gatherings with Friends and Family: Once a week    Attends Religious Services: 1 to 4 times per year    Active Member of [employer] or Organizations: No    Attends Banker Meetings: Never    Marital Status: Married  Catering Manager Violence: Not At Risk (06/05/2023)   Humiliation, Afraid, Rape, and Kick questionnaire    Fear of Current or Ex-Partner: No    Emotionally Abused: No    Physically Abused: No    Sexually Abused: No  Depression (PHQ2-9): Low Risk (06/05/2023)   Depression (PHQ2-9)    PHQ-2 Score: 2  Alcohol Screen: Low Risk (06/05/2023)   Alcohol Screen    Last Alcohol Screening Score (AUDIT): 2  Housing: Low Risk (06/05/2023)   Housing Stability Vital Sign    Unable to Pay for Housing in the Last Year: No    Number of Times Moved in the Last Year: 0    Homeless in the Last Year: No  Utilities: Not At Risk (06/05/2023)   AHC Utilities    Threatened with loss of utilities: No  Health Literacy: Not on file     PHYSICAL EXAM  There were no vitals filed for this visit.  There is no height or weight on file to calculate BMI.  Generalized: Well developed, in no acute distress  Cardiology: normal rate and rhythm, no murmur noted Respiratory: clear to auscultation bilaterally  Neurological examination  Mentation: Alert oriented to time, place, history taking. Follows all commands speech and language  fluent Cranial nerve II-XII: Pupils were equal round reactive to light. Extraocular movements were full, visual field were full  Motor: The motor testing reveals 5 over 5 strength of all 4 extremities. Good symmetric motor tone is noted throughout.  Gait and station: Gait is normal.    DIAGNOSTIC DATA (LABS, IMAGING, TESTING) - I reviewed patient records, labs, notes, testing and imaging myself where available.      No data to display           Lab Results  Component Value Date   WBC 11.6 (H) 01/08/2022   HGB 15.1 01/08/2022   HCT 44.2 01/08/2022   MCV 90 01/08/2022   PLT 342 01/08/2022      Component Value Date/Time   NA 140 01/08/2022 0805   K 4.1 01/08/2022 0805   CL 102 01/08/2022 0805   CO2 21 01/08/2022 0805   GLUCOSE 101 (H) 01/08/2022 0805   GLUCOSE 104 (H) 07/14/2014 0459   BUN 9 01/08/2022 0805   CREATININE 0.85 01/08/2022 0805   CALCIUM  9.7 01/08/2022 0805   PROT 7.1 01/08/2022 0805   ALBUMIN 4.8 01/08/2022 0805   AST 16 01/08/2022 0805   ALT 13 01/08/2022 0805   ALKPHOS 69 01/08/2022 0805   BILITOT 0.3 01/08/2022 0805   GFRNONAA 108 08/25/2017 1603   GFRAA 125 08/25/2017 1603   Lab Results  Component Value Date   CHOL 184 01/08/2022   HDL 29 (L) 01/08/2022   LDLCALC 122 (H) 01/08/2022   TRIG 187 (H) 01/08/2022   CHOLHDL 6.3 (H) 01/08/2022   No results found for: HGBA1C No results found for: VITAMINB12 Lab Results  Component Value Date   TSH 1.050 08/22/2020     ASSESSMENT AND PLAN 48 y.o. year old female  has a past medical history of Benign essential  hypertension (2019), Genital warts, Hematuria, HPV (human papilloma virus) infection, Pre-eclampsia, and Sleep apnea. here with   No diagnosis found.     XOCHILT CONANT is doing well on CPAP therapy. Compliance report reveals excellent compliance.She was encouraged to continue using CPAP nightly and for greater than 4 hours each night. I will check AHI and pressure settings in 6-8 weeks.  We will update supply orders as indicated. Risks of untreated sleep apnea review and education materials provided. Healthy lifestyle habits encouraged. She will follow up in 1 year, sooner if needed. She verbalizes understanding and agreement with this plan.    No orders of the defined types were placed in this encounter.    No orders of the defined types were placed in this encounter.     Greig Forbes, FNP-C 03/01/2024, 12:47 PM [facility] [address], [address] [place], [place] [address] [phone]

## 2024-03-02 ENCOUNTER — Telehealth: Payer: Self-pay

## 2024-03-02 ENCOUNTER — Ambulatory Visit: Admitting: Family Medicine

## 2024-03-02 ENCOUNTER — Encounter: Payer: Self-pay | Admitting: Family Medicine

## 2024-03-02 VITALS — BP 134/76 | HR 97 | Ht 62.0 in | Wt 170.0 lb

## 2024-03-02 DIAGNOSIS — G4733 Obstructive sleep apnea (adult) (pediatric): Secondary | ICD-10-CM

## 2024-03-02 NOTE — Telephone Encounter (Signed)
 Order sent thru epic community msg to DME: [facility] [phone] Fax:[phone]

## 2024-03-02 NOTE — Patient Instructions (Signed)

## 2024-03-03 ENCOUNTER — Telehealth: Payer: Self-pay

## 2025-03-28 ENCOUNTER — Telehealth: Admitting: Family Medicine
